# Patient Record
Sex: Male | Born: 1945
Health system: Southern US, Community
[De-identification: ages and names within clinical notes are randomized; demographics above are authoritative.]

## PROBLEM LIST (undated history)

## (undated) DIAGNOSIS — E785 Hyperlipidemia, unspecified: Secondary | ICD-10-CM

## (undated) DIAGNOSIS — I10 Essential (primary) hypertension: Secondary | ICD-10-CM

## (undated) HISTORY — DX: Hyperlipidemia, unspecified: E78.5

## (undated) HISTORY — DX: Essential (primary) hypertension: I10

---

## 1995-02-21 LAB — BASIC METABOLIC PANEL
BUN: 16 (ref 4–21)
BUN: 16 (ref 4–21)
CO2: 28 — AB (ref 13–22)
CO2: 28 — AB (ref 13–22)
Chloride: 108 (ref 99–108)
Chloride: 108 (ref 99–108)
Creatinine: 1.2 (ref 0.6–1.3)
Creatinine: 1.2 (ref 0.6–1.3)
Glucose: 92
Glucose: 92
Potassium: 4.7 mEq/L (ref 3.5–5.1)
Potassium: 4.7 mEq/L (ref 3.5–5.1)
Sodium: 144 (ref 137–147)
Sodium: 144 (ref 137–147)

## 1995-02-21 LAB — LIPID PANEL
Cholesterol: 217 — AB (ref 0–200)
HDL: 48 (ref 35–70)
LDL Cholesterol: 149
LDl/HDL Ratio: 4.5
Triglycerides: 101 (ref 40–160)

## 1995-02-21 LAB — CBC AND DIFFERENTIAL
HCT: 44 (ref 41–53)
HCT: 44 (ref 41–53)
Hemoglobin: 14.8 (ref 13.5–17.5)
Hemoglobin: 14.8 (ref 13.5–17.5)
Platelets: 201 10*3/uL (ref 150–400)
Platelets: 201 10*3/uL (ref 150–400)
WBC: 6.3
WBC: 6.3

## 1995-02-21 LAB — HEPATIC FUNCTION PANEL
ALT: 15 U/L (ref 10–40)
AST: 18 (ref 14–40)
Alkaline Phosphatase: 65 (ref 25–125)
Alkaline Phosphatase: 65 (ref 25–125)
Bilirubin, Direct: 0.1 (ref 0.01–0.4)
Bilirubin, Total: 0.4

## 1995-02-21 LAB — COMPREHENSIVE METABOLIC PANEL
Albumin: 4.7 (ref 3.5–5.0)
Calcium: 8.7 (ref 8.7–10.7)
Calcium: 8.7 (ref 8.7–10.7)
Globulin: 2.3

## 1995-02-21 LAB — CBC
RBC: 4.45 (ref 3.87–5.11)
RBC: 4.45 (ref 3.87–5.11)

## 1995-02-21 LAB — PSA: PSA: 1.5

## 1995-03-24 LAB — CBC AND DIFFERENTIAL
HCT: 44 (ref 41–53)
Hemoglobin: 14.8 (ref 13.5–17.5)
Platelets: 201 10*3/uL (ref 150–400)
WBC: 6.3

## 1995-03-24 LAB — BASIC METABOLIC PANEL
BUN: 16 (ref 4–21)
Chloride: 108 (ref 99–108)
Creatinine: 1.2 (ref 0.6–1.3)
Glucose: 92
Potassium: 4.7 mEq/L (ref 3.5–5.1)
Sodium: 144 (ref 137–147)

## 1995-03-24 LAB — CBC: RBC: 4.45 (ref 3.87–5.11)

## 1996-09-17 LAB — BASIC METABOLIC PANEL
BUN: 17 (ref 4–21)
Chloride: 107 (ref 99–108)
Creatinine: 1.1 (ref 0.6–1.3)
Glucose: 101
Potassium: 4.4 mEq/L (ref 3.5–5.1)
Sodium: 141 (ref 137–147)

## 1996-09-17 LAB — TSH: TSH: 0.64 (ref 0.41–5.90)

## 1996-09-17 LAB — LIPID PANEL
Cholesterol: 219 — AB (ref 0–200)
HDL: 62 (ref 35–70)
LDL Cholesterol: 142
LDl/HDL Ratio: 3.5
Triglycerides: 73 (ref 40–160)

## 1996-09-17 LAB — CBC: RBC: 4.56 (ref 3.87–5.11)

## 1996-09-17 LAB — HEPATIC FUNCTION PANEL
AST: 23 (ref 14–40)
Alkaline Phosphatase: 68 (ref 25–125)
Bilirubin, Total: 0.6

## 1996-09-17 LAB — CBC AND DIFFERENTIAL
HCT: 46 (ref 41–53)
Hemoglobin: 15.2 (ref 13.5–17.5)
Platelets: 182 10*3/uL (ref 150–400)
WBC: 10.2

## 1996-09-17 LAB — PSA: PSA: 1.4

## 1996-09-17 LAB — COMPREHENSIVE METABOLIC PANEL
Albumin: 4.5 (ref 3.5–5.0)
Calcium: 9.2 (ref 8.7–10.7)

## 1997-12-03 LAB — BASIC METABOLIC PANEL
BUN: 18 (ref 4–21)
CO2: 25 — AB (ref 13–22)
Chloride: 106 (ref 99–108)
Creatinine: 1.1 (ref <=1.3)
Glucose: 87
Potassium: 4.5 mEq/L (ref 3.5–5.1)

## 1997-12-03 LAB — LIPID PANEL
Cholesterol: 272 — AB (ref 0–200)
HDL: 53 (ref 35–70)
LDL Cholesterol: 177
LDl/HDL Ratio: 5.1
Triglycerides: 211 — AB (ref 40–160)

## 1997-12-03 LAB — HEPATIC FUNCTION PANEL
AST: 24 (ref 14–40)
Alkaline Phosphatase: 76 (ref 25–125)
Bilirubin, Total: 7.1

## 1997-12-03 LAB — CBC AND DIFFERENTIAL
HCT: 47 (ref 41–53)
Hemoglobin: 16.3 (ref 13.5–17.5)
Platelets: 259 10*3/uL (ref 150–400)
WBC: 7

## 1997-12-03 LAB — COMPREHENSIVE METABOLIC PANEL
Albumin: 4.8 (ref 3.5–5.0)
Calcium: 10.3 (ref 8.7–10.7)

## 1997-12-03 LAB — CBC: RBC: 4.93 (ref 3.87–5.11)

## 1997-12-03 LAB — IRON,TIBC AND FERRITIN PANEL: Iron: 110

## 1998-01-12 LAB — HEPATIC FUNCTION PANEL
ALT: 24 U/L (ref 10–40)
AST: 26 (ref 14–40)
Alkaline Phosphatase: 69 (ref 25–125)
Alkaline Phosphatase: 69 (ref 25–125)
Bilirubin, Direct: 0.1 (ref 0.01–0.4)
Bilirubin, Total: 0.3

## 1998-01-12 LAB — PSA: PSA: 1.5

## 1998-01-12 LAB — COMPREHENSIVE METABOLIC PANEL: Albumin: 4.3 (ref 3.5–5.0)

## 1998-04-02 LAB — LIPID PANEL
Cholesterol: 173 (ref 0–200)
HDL: 53 (ref 35–70)
LDL Cholesterol: 107
LDl/HDL Ratio: 3.3
Triglycerides: 66 (ref 40–160)

## 1998-09-03 LAB — BASIC METABOLIC PANEL
BUN: 14 (ref 4–21)
CO2: 26 — AB (ref 13–22)
Chloride: 100 (ref 99–108)
Creatinine: 1.2 (ref <=1.3)
Glucose: 90
Potassium: 4 mEq/L (ref 3.5–5.1)
Sodium: 138 (ref 137–147)

## 1998-09-03 LAB — HEPATIC FUNCTION PANEL: Alkaline Phosphatase: 58 (ref 25–125)

## 1998-10-04 LAB — HEPATIC FUNCTION PANEL
ALT: 87 U/L — AB (ref 10–40)
AST: 50 — AB (ref 14–40)
Bilirubin, Direct: 0.1 (ref 0.01–0.4)
Bilirubin, Total: 0.7

## 1998-10-04 LAB — COMPREHENSIVE METABOLIC PANEL: Albumin: 4.2 (ref 3.5–5.0)

## 1998-10-15 LAB — COMPREHENSIVE METABOLIC PANEL: Albumin: 3.6 (ref 3.5–5.0)

## 1998-10-15 LAB — HEPATIC FUNCTION PANEL
ALT: 25 U/L (ref 10–40)
AST: 19 (ref 14–40)
Bilirubin, Direct: 0.3
Bilirubin, Total: 0.5

## 1999-03-02 LAB — LIPID PANEL
Cholesterol: 151 (ref 0–200)
HDL: 57 (ref 35–70)
LDL Cholesterol: 79
LDl/HDL Ratio: 2.6
Triglycerides: 75 (ref 40–160)

## 1999-03-02 LAB — HEPATIC FUNCTION PANEL
ALT: 30 U/L (ref 10–40)
AST: 33 (ref 14–40)
Alkaline Phosphatase: 56 (ref 25–125)
Bilirubin, Total: 0.8

## 1999-03-02 LAB — CBC AND DIFFERENTIAL
HCT: 49 (ref 41–53)
Hemoglobin: 16.3 (ref 13.5–17.5)
Platelets: 207 10*3/uL (ref 150–400)
WBC: 7.6

## 1999-03-02 LAB — COMPREHENSIVE METABOLIC PANEL
Albumin: 4.6 (ref 3.5–5.0)
Calcium: 9.5 (ref 8.7–10.7)

## 1999-03-02 LAB — BASIC METABOLIC PANEL
BUN: 19 (ref 4–21)
CO2: 22 (ref 13–22)
Chloride: 104 (ref 99–108)
Creatinine: 1.3 (ref 0.6–1.3)
Glucose: 85
Potassium: 4.4 mEq/L (ref 3.5–5.1)
Sodium: 141 (ref 137–147)

## 1999-03-02 LAB — CBC: RBC: 4.86 (ref 3.87–5.11)

## 1999-03-02 LAB — PSA: PSA: 1.3

## 2000-03-07 LAB — CBC AND DIFFERENTIAL
HCT: 48 (ref 41–53)
Hemoglobin: 16 (ref 13.5–17.5)
Platelets: 179 10*3/uL (ref 150–400)
WBC: 7.6

## 2000-03-07 LAB — LIPID PANEL
Cholesterol: 172 (ref 0–200)
HDL: 58 (ref 35–70)
LDL Cholesterol: 102
LDl/HDL Ratio: 3
Triglycerides: 62 (ref 40–160)

## 2000-03-07 LAB — BASIC METABOLIC PANEL
BUN: 15 (ref 4–21)
CO2: 21 (ref 13–22)
Chloride: 107 (ref 99–108)
Creatinine: 1.3 (ref 0.6–1.3)
Glucose: 90
Potassium: 4.2 mEq/L (ref 3.5–5.1)
Sodium: 142 (ref 137–147)

## 2000-03-07 LAB — PSA: PSA: 1.56

## 2000-03-07 LAB — COMPREHENSIVE METABOLIC PANEL
Albumin: 4.9 (ref 3.5–5.0)
Calcium: 10.2 (ref 8.7–10.7)

## 2000-03-07 LAB — HEPATIC FUNCTION PANEL
ALT: 19 U/L (ref 10–40)
AST: 23 (ref 14–40)
Alkaline Phosphatase: 64 (ref 25–125)
Bilirubin, Total: 0.5

## 2000-03-07 LAB — CBC: RBC: 4.81 (ref 3.87–5.11)

## 2000-05-09 LAB — POCT ERYTHROCYTE SEDIMENTATION RATE, NON-AUTOMATED: Sed Rate: 7

## 2000-09-05 LAB — TSH: TSH: 0.87 (ref <=5.90)

## 2001-03-07 LAB — CBC AND DIFFERENTIAL
HCT: 44 (ref 41–53)
Hemoglobin: 15.4 (ref 13.5–17.5)
Platelets: 180 10*3/uL (ref 150–400)
WBC: 6.4

## 2001-03-07 LAB — PSA: PSA: 1.29

## 2001-03-07 LAB — BASIC METABOLIC PANEL
BUN: 16 (ref 4–21)
CO2: 24 — AB (ref 13–22)
Chloride: 105 (ref 99–108)
Creatinine: 1.3 (ref 0.6–1.3)
Glucose: 84
Potassium: 4.3 mEq/L (ref 3.5–5.1)
Sodium: 141 (ref 137–147)

## 2001-03-07 LAB — HEPATIC FUNCTION PANEL
ALT: 19 U/L (ref 10–40)
AST: 25 (ref 14–40)
Alkaline Phosphatase: 53 (ref 25–125)
Bilirubin, Total: 0.8

## 2001-03-07 LAB — CBC: RBC: 4.57 (ref 3.87–5.11)

## 2001-03-07 LAB — COMPREHENSIVE METABOLIC PANEL
Albumin: 4.7 (ref 3.5–5.0)
Calcium: 9.9 (ref 8.7–10.7)

## 2001-04-27 LAB — LIPID PANEL
Cholesterol: 194 (ref 0–200)
HDL: 58 (ref 35–70)
LDL Cholesterol: 121
LDl/HDL Ratio: 3.3
Triglycerides: 74 (ref 40–160)

## 2001-04-27 LAB — COMPREHENSIVE METABOLIC PANEL
Albumin: 4.5 (ref 3.5–5.0)
Globulin: 2

## 2001-04-27 LAB — HEPATIC FUNCTION PANEL
ALT: 30 U/L (ref 10–40)
AST: 30 (ref 14–40)
Alkaline Phosphatase: 68 (ref 25–125)
Bilirubin, Total: 0.5

## 2002-01-15 LAB — FECAL OCCULT BLOOD, GUAIAC: Fecal Occult Blood: NEGATIVE

## 2002-03-08 LAB — COMPREHENSIVE METABOLIC PANEL
Albumin: 5 (ref 3.5–5.0)
Calcium: 9.2 (ref 8.7–10.7)

## 2002-03-08 LAB — CBC AND DIFFERENTIAL
HCT: 45 (ref 41–53)
Hemoglobin: 15.2 (ref 13.5–17.5)
Platelets: 201 10*3/uL (ref 150–400)
WBC: 7.9
WBC: 7.9

## 2002-03-08 LAB — BASIC METABOLIC PANEL
CO2: 20 (ref 13–22)
Chloride: 106 (ref 99–108)
Glucose: 90
Potassium: 4.3 mEq/L (ref 3.5–5.1)
Sodium: 140 (ref 137–147)

## 2002-03-08 LAB — HEPATIC FUNCTION PANEL
ALT: 18 U/L (ref 10–40)
AST: 21 (ref 14–40)
Alkaline Phosphatase: 62 (ref 25–125)
Bilirubin, Total: 0.6

## 2002-03-08 LAB — CBC
RBC: 4.56 (ref 3.87–5.11)
RBC: 4.59 (ref 3.87–5.11)

## 2002-03-08 LAB — LIPID PANEL
Cholesterol: 176 (ref 0–200)
HDL: 59 (ref 35–70)
LDL Cholesterol: 100
LDl/HDL Ratio: 3
Triglycerides: 85 (ref 40–160)

## 2002-03-08 LAB — PSA: PSA: 1.54

## 2002-09-05 DIAGNOSIS — L280 Lichen simplex chronicus: Secondary | ICD-10-CM

## 2002-09-05 HISTORY — DX: Lichen simplex chronicus: L28.0

## 2003-03-12 LAB — BASIC METABOLIC PANEL
BUN: 18 (ref 4–21)
CO2: 27 — AB (ref 13–22)
Chloride: 105 (ref 99–108)
Creatinine: 1.2 (ref 0.6–1.3)
Glucose: 90
Potassium: 4.8 mEq/L (ref 3.5–5.1)
Sodium: 141 (ref 137–147)

## 2003-03-12 LAB — CBC: RBC: 4.61 (ref 3.87–5.11)

## 2003-03-12 LAB — LIPID PANEL
Cholesterol: 164 (ref 0–200)
HDL: 60 (ref 35–70)
LDL Cholesterol: 89
LDl/HDL Ratio: 2.7
Triglycerides: 77 (ref 40–160)

## 2003-03-12 LAB — COMPREHENSIVE METABOLIC PANEL
Albumin: 4.6 (ref 3.5–5.0)
Calcium: 9.6 (ref 8.7–10.7)

## 2003-03-12 LAB — CBC AND DIFFERENTIAL
HCT: 46 (ref 41–53)
Hemoglobin: 15.5 (ref 13.5–17.5)
Platelets: 190 10*3/uL (ref 150–400)
WBC: 6.7

## 2003-03-12 LAB — HEPATIC FUNCTION PANEL
ALT: 20 U/L (ref 10–40)
AST: 23 (ref 14–40)
Alkaline Phosphatase: 56 (ref 25–125)
Bilirubin, Total: 0.6

## 2003-05-21 LAB — BASIC METABOLIC PANEL
BUN: 18 (ref 4–21)
CO2: 31 — AB (ref 13–22)
Chloride: 94 — AB (ref 99–108)
Creatinine: 1.4 — AB (ref 0.6–1.3)
Glucose: 89
Potassium: 4.6 mEq/L (ref 3.5–5.1)
Sodium: 137 (ref 137–147)

## 2003-05-21 LAB — COMPREHENSIVE METABOLIC PANEL
Albumin: 4.7 (ref 3.5–5.0)
Calcium: 9.9 (ref 8.7–10.7)

## 2003-05-21 LAB — HEPATIC FUNCTION PANEL
ALT: 27 U/L (ref 10–40)
AST: 29 (ref 14–40)
Alkaline Phosphatase: 55 (ref 25–125)
Bilirubin, Total: 0.6

## 2003-09-16 LAB — BASIC METABOLIC PANEL
BUN: 15 (ref 4–21)
CO2: 27 — AB (ref 13–22)
Chloride: 100 (ref 99–108)
Creatinine: 1.2 (ref 0.6–1.3)
Glucose: 91
Potassium: 4.1 mEq/L (ref 3.5–5.1)
Sodium: 138 (ref 137–147)

## 2003-09-16 LAB — HEPATIC FUNCTION PANEL
ALT: 18 U/L (ref 10–40)
AST: 23 (ref 14–40)
Alkaline Phosphatase: 59 (ref 25–125)
Bilirubin, Total: 0.7

## 2003-09-16 LAB — COMPREHENSIVE METABOLIC PANEL
Albumin: 4.7 (ref 3.5–5.0)
Calcium: 9.6 (ref 8.7–10.7)

## 2004-03-31 LAB — LIPID PANEL
Cholesterol: 174 (ref 0–200)
HDL: 59 (ref 35–70)
LDL Cholesterol: 95
LDl/HDL Ratio: 2.9
Triglycerides: 98 (ref 40–160)

## 2004-03-31 LAB — COMPREHENSIVE METABOLIC PANEL
Albumin: 4.6 (ref 3.5–5.0)
Calcium: 9.4 (ref 8.7–10.7)

## 2004-03-31 LAB — HEPATIC FUNCTION PANEL
ALT: 16 U/L (ref 10–40)
AST: 24 (ref 14–40)
Alkaline Phosphatase: 51 (ref 25–125)
Bilirubin, Total: 0.8

## 2004-03-31 LAB — BASIC METABOLIC PANEL
BUN: 17 (ref 4–21)
CO2: 25 — AB (ref 13–22)
Chloride: 96 — AB (ref 99–108)
Creatinine: 1.1 (ref 0.6–1.3)
Glucose: 89
Potassium: 3.7 mEq/L (ref 3.5–5.1)
Sodium: 133 — AB (ref 137–147)

## 2004-03-31 LAB — CBC AND DIFFERENTIAL
HCT: 44 (ref 41–53)
Hemoglobin: 15.3 (ref 13.5–17.5)
Platelets: 234 10*3/uL (ref 150–400)
WBC: 6.5

## 2004-03-31 LAB — TSH: TSH: 0.18 — AB (ref 0.41–5.90)

## 2004-03-31 LAB — PSA: PSA: 1.45

## 2004-03-31 LAB — CBC: RBC: 4.58 (ref 3.87–5.11)

## 2004-10-06 LAB — HEPATIC FUNCTION PANEL
ALT: 18 U/L (ref 10–40)
AST: 23 (ref 14–40)
Alkaline Phosphatase: 54 (ref 25–125)
Bilirubin, Total: 0.6

## 2004-10-06 LAB — LIPID PANEL
Cholesterol: 163 (ref 0–200)
HDL: 58 (ref 35–70)
LDL Cholesterol: 92
LDl/HDL Ratio: 2.8
Triglycerides: 63 (ref 40–160)

## 2004-10-06 LAB — BASIC METABOLIC PANEL
BUN: 14 (ref 4–21)
CO2: 28 — AB (ref 13–22)
Chloride: 96 — AB (ref 99–108)
Creatinine: 1.1 (ref 0.6–1.3)
Glucose: 86
Potassium: 3.9 mEq/L (ref 3.5–5.1)
Sodium: 132 — AB (ref 137–147)

## 2004-10-06 LAB — COMPREHENSIVE METABOLIC PANEL
Albumin: 4.5 (ref 3.5–5.0)
Calcium: 8.9 (ref 8.7–10.7)

## 2004-10-07 LAB — BASIC METABOLIC PANEL
BUN: 14 (ref 4–21)
CO2: 28 — AB (ref 13–22)
Chloride: 96 — AB (ref 99–108)
Creatinine: 1.1 (ref 0.6–1.3)
Glucose: 86
Potassium: 3.9 mEq/L (ref 3.5–5.1)
Sodium: 132 — AB (ref 137–147)

## 2004-10-07 LAB — LIPID PANEL
Cholesterol: 163 (ref 0–200)
HDL: 58 (ref 35–70)
LDL Cholesterol: 92
LDl/HDL Ratio: 2.8
Triglycerides: 63 (ref 40–160)

## 2004-10-07 LAB — HEPATIC FUNCTION PANEL
ALT: 18 U/L (ref 10–40)
AST: 23 (ref 14–40)
Alkaline Phosphatase: 54 (ref 25–125)
Bilirubin, Total: 0.6

## 2004-10-07 LAB — COMPREHENSIVE METABOLIC PANEL WITH GFR
Albumin: 4.5 (ref 3.5–5.0)
Calcium: 8.9 (ref 8.7–10.7)

## 2004-10-18 LAB — COMPREHENSIVE METABOLIC PANEL
Albumin: 4.2 (ref 3.5–5.0)
Calcium: 9.8 (ref 8.7–10.7)

## 2004-10-18 LAB — BASIC METABOLIC PANEL WITH GFR
BUN: 11 (ref 4–21)
CO2: 29 — AB (ref 13–22)
Chloride: 94 — AB (ref 99–108)
Creatinine: 1.2 (ref 0.6–1.3)
Glucose: 101
Potassium: 4.2 meq/L (ref 3.5–5.1)
Sodium: 133 — AB (ref 137–147)

## 2004-10-18 LAB — HEPATIC FUNCTION PANEL
ALT: 19 U/L (ref 10–40)
AST: 23 (ref 14–40)
Alkaline Phosphatase: 55 (ref 25–125)
Bilirubin, Total: 0.5

## 2004-11-01 LAB — BASIC METABOLIC PANEL
BUN: 12 (ref 4–21)
CO2: 25 — AB (ref 13–22)
Chloride: 103 (ref 99–108)
Creatinine: 1.1 (ref 0.6–1.3)
Glucose: 89
Potassium: 4.5 mEq/L (ref 3.5–5.1)
Sodium: 138 (ref 137–147)

## 2004-11-01 LAB — HEPATIC FUNCTION PANEL
ALT: 15 U/L (ref 10–40)
AST: 18 (ref 14–40)
Alkaline Phosphatase: 46 (ref 25–125)
Bilirubin, Total: 0.5

## 2004-11-01 LAB — COMPREHENSIVE METABOLIC PANEL: Calcium: 9.4 (ref 8.7–10.7)

## 2005-04-19 LAB — LIPID PANEL
Cholesterol: 168 (ref 0–200)
HDL: 58 (ref 35–70)
LDL Cholesterol: 95
LDl/HDL Ratio: 2.9
Triglycerides: 73 (ref 40–160)

## 2005-04-19 LAB — BASIC METABOLIC PANEL
BUN: 12 (ref 4–21)
CO2: 25 — AB (ref 13–22)
Chloride: 96 — AB (ref 99–108)
Creatinine: 1.1 (ref 0.6–1.3)
Glucose: 81
Potassium: 3.9 mEq/L (ref 3.5–5.1)
Sodium: 136 — AB (ref 137–147)

## 2005-04-19 LAB — CBC AND DIFFERENTIAL
HCT: 45 (ref 41–53)
Hemoglobin: 16.1 (ref 13.5–17.5)
Platelets: 222 10*3/uL (ref 150–400)
WBC: 6

## 2005-04-19 LAB — HEPATIC FUNCTION PANEL
ALT: 23 U/L (ref 10–40)
AST: 27 (ref 14–40)
Alkaline Phosphatase: 55 (ref 25–125)
Bilirubin, Total: 0.6

## 2005-04-19 LAB — COMPREHENSIVE METABOLIC PANEL
Albumin: 4.6 (ref 3.5–5.0)
Calcium: 9.5 (ref 8.7–10.7)

## 2005-04-19 LAB — PSA: PSA: 1.24

## 2005-04-19 LAB — CBC: RBC: 4.73 (ref 3.87–5.11)

## 2005-10-24 LAB — BASIC METABOLIC PANEL
BUN: 14 (ref 4–21)
CO2: 28 — AB (ref 13–22)
Chloride: 96 — AB (ref 99–108)
Creatinine: 1.1 (ref 0.6–1.3)
Glucose: 68
Potassium: 4.2 mEq/L (ref 3.5–5.1)
Sodium: 136 — AB (ref 137–147)

## 2005-10-24 LAB — HEPATIC FUNCTION PANEL
ALT: 15 U/L (ref 10–40)
AST: 20 (ref 14–40)
Alkaline Phosphatase: 54 (ref 25–125)
Bilirubin, Total: 0.6

## 2005-10-24 LAB — COMPREHENSIVE METABOLIC PANEL
Albumin: 4.4 (ref 3.5–5.0)
Calcium: 9.6 (ref 8.7–10.7)

## 2006-05-26 LAB — CBC AND DIFFERENTIAL
HCT: 47 (ref 41–53)
Hemoglobin: 16.1 (ref 13.5–17.5)
Platelets: 200 10*3/uL (ref 150–400)
WBC: 6.7

## 2006-05-26 LAB — LIPID PANEL
Cholesterol: 217 — AB (ref 0–200)
HDL: 60 (ref 35–70)
LDL Cholesterol: 135
LDl/HDL Ratio: 3.6
Triglycerides: 112 (ref 40–160)

## 2006-05-26 LAB — BASIC METABOLIC PANEL
BUN: 16 (ref 4–21)
CO2: 26 — AB (ref 13–22)
Chloride: 101 (ref 99–108)
Creatinine: 1.1 (ref 0.6–1.3)
Glucose: 83
Potassium: 3.6 mEq/L (ref 3.5–5.1)
Sodium: 140 (ref 137–147)

## 2006-05-26 LAB — HEPATIC FUNCTION PANEL
ALT: 21 U/L (ref 10–40)
AST: 22 (ref 14–40)
Alkaline Phosphatase: 60 (ref 25–125)
Bilirubin, Total: 0.8

## 2006-05-26 LAB — CBC: RBC: 4.8 (ref 3.87–5.11)

## 2006-05-26 LAB — COMPREHENSIVE METABOLIC PANEL
Albumin: 4.5 (ref 3.5–5.0)
Calcium: 9.3 (ref 8.7–10.7)

## 2006-05-26 LAB — PSA: PSA: 3.08

## 2006-12-20 LAB — BASIC METABOLIC PANEL
BUN: 17 (ref 4–21)
CO2: 26 — AB (ref 13–22)
Chloride: 102 (ref 99–108)
Creatinine: 1.1 (ref 0.6–1.3)
Glucose: 87
Potassium: 4.2 mEq/L (ref 3.5–5.1)
Sodium: 139 (ref 137–147)

## 2006-12-20 LAB — LIPID PANEL
Cholesterol: 188 (ref 0–200)
HDL: 57 (ref 35–70)
LDL Cholesterol: 111
LDl/HDL Ratio: 3.3
Triglycerides: 102 (ref 40–160)

## 2006-12-20 LAB — HEPATIC FUNCTION PANEL
ALT: 27 U/L (ref 10–40)
AST: 26 (ref 14–40)
Alkaline Phosphatase: 58 (ref 25–125)
Bilirubin, Total: 0.6

## 2006-12-20 LAB — COMPREHENSIVE METABOLIC PANEL
Albumin: 4.6 (ref 3.5–5.0)
Calcium: 9.7 (ref 8.7–10.7)

## 2006-12-20 LAB — PSA: PSA: 2

## 2007-10-16 LAB — PSA: PSA: 2.03

## 2007-10-16 LAB — HEPATIC FUNCTION PANEL
ALT: 17 U/L (ref 10–40)
AST: 22 (ref 14–40)
Alkaline Phosphatase: 61 (ref 25–125)
Bilirubin, Total: 0.6

## 2007-10-16 LAB — CBC AND DIFFERENTIAL
HCT: 47 (ref 41–53)
Hemoglobin: 15.9 (ref 13.5–17.5)
Platelets: 226 10*3/uL (ref 150–400)
WBC: 5.8

## 2007-10-16 LAB — BASIC METABOLIC PANEL
BUN: 12 (ref 4–21)
CO2: 25 — AB (ref 13–22)
Chloride: 100 (ref 99–108)
Creatinine: 1.3 (ref 0.6–1.3)
Glucose: 89
Potassium: 4 mEq/L (ref 3.5–5.1)
Sodium: 138 (ref 137–147)

## 2007-10-16 LAB — LIPID PANEL
Cholesterol: 177 (ref 0–200)
HDL: 58 (ref 35–70)
LDL Cholesterol: 100
LDl/HDL Ratio: 3.1
Triglycerides: 97 (ref 40–160)

## 2007-10-16 LAB — COMPREHENSIVE METABOLIC PANEL
Albumin: 4.6 (ref 3.5–5.0)
Calcium: 9.3 (ref 8.7–10.7)

## 2007-10-16 LAB — CBC: RBC: 4.66 (ref 3.87–5.11)

## 2008-04-16 LAB — BASIC METABOLIC PANEL
BUN: 13 (ref 4–21)
CO2: 26 — AB (ref 13–22)
Chloride: 102 (ref 99–108)
Creatinine: 1.2 (ref 0.6–1.3)
Potassium: 4.5 mEq/L (ref 3.5–5.1)
Sodium: 142 (ref 137–147)

## 2008-04-16 LAB — LIPID PANEL
Cholesterol: 164 (ref 0–200)
HDL: 53 (ref 35–70)
LDL Cholesterol: 92
LDl/HDL Ratio: 3.1
Triglycerides: 95 (ref 40–160)

## 2008-04-16 LAB — HEPATIC FUNCTION PANEL
ALT: 21 U/L (ref 10–40)
AST: 24 (ref 14–40)
Alkaline Phosphatase: 53 (ref 25–125)
Bilirubin, Total: 0.5

## 2008-04-16 LAB — COMPREHENSIVE METABOLIC PANEL
Albumin: 4.2 (ref 3.5–5.0)
Calcium: 9.4 (ref 8.7–10.7)

## 2008-11-29 LAB — CBC AND DIFFERENTIAL
HCT: 44 (ref 41–53)
Hemoglobin: 15.4 (ref 13.5–17.5)
Platelets: 201 10*3/uL (ref 150–400)
WBC: 5.6

## 2008-11-29 LAB — LIPID PANEL
Cholesterol: 168 (ref 0–200)
HDL: 54 (ref 35–70)
LDL Cholesterol: 97
LDl/HDL Ratio: 3.1
Triglycerides: 84 (ref 40–160)

## 2008-11-29 LAB — BASIC METABOLIC PANEL
BUN: 10 (ref 4–21)
CO2: 28 — AB (ref 13–22)
Chloride: 96 — AB (ref 99–108)
Creatinine: 1 (ref 0.6–1.3)
Glucose: 85
Potassium: 3.9 mEq/L (ref 3.5–5.1)
Sodium: 133 — AB (ref 137–147)

## 2008-11-29 LAB — COMPREHENSIVE METABOLIC PANEL
Albumin: 4.4 (ref 3.5–5.0)
Calcium: 9.6 (ref 8.7–10.7)

## 2008-11-29 LAB — HEPATIC FUNCTION PANEL
ALT: 13 U/L (ref 10–40)
AST: 16 (ref 14–40)
Alkaline Phosphatase: 53 (ref 25–125)
Bilirubin, Total: 0.5

## 2008-11-29 LAB — CBC: RBC: 4.55 (ref 3.87–5.11)

## 2008-11-29 LAB — PSA: PSA: 1.98

## 2008-12-02 LAB — FECAL OCCULT BLOOD, GUAIAC: Fecal Occult Blood: NEGATIVE

## 2009-06-06 LAB — BASIC METABOLIC PANEL
BUN: 17 (ref 4–21)
CO2: 25 — AB (ref 13–22)
Chloride: 101 (ref 99–108)
Creatinine: 1.2 (ref 0.6–1.3)
Glucose: 94
Potassium: 4.2 mEq/L (ref 3.5–5.1)
Sodium: 137 (ref 137–147)

## 2009-06-06 LAB — HEPATIC FUNCTION PANEL
ALT: 16 U/L (ref 10–40)
AST: 23 (ref 14–40)
Alkaline Phosphatase: 54 (ref 25–125)
Bilirubin, Total: 0.7

## 2009-06-06 LAB — LIPID PANEL
Cholesterol: 175 (ref 0–200)
HDL: 62 (ref 35–70)
LDL Cholesterol: 93
LDl/HDL Ratio: 2.8
Triglycerides: 98 (ref 40–160)

## 2009-06-06 LAB — COMPREHENSIVE METABOLIC PANEL: Calcium: 10 (ref 8.7–10.7)

## 2009-12-09 LAB — CBC AND DIFFERENTIAL
HCT: 49 (ref 41–53)
Hemoglobin: 16.5 (ref 13.5–17.5)
Platelets: 230 10*3/uL (ref 150–400)
WBC: 8

## 2009-12-09 LAB — LIPID PANEL
Cholesterol: 179 (ref 0–200)
HDL: 57 (ref 35–70)
LDL Cholesterol: 103
LDl/HDL Ratio: 3.1
Triglycerides: 95 (ref 40–160)

## 2009-12-09 LAB — HEPATIC FUNCTION PANEL
ALT: 22 U/L (ref 10–40)
AST: 31 (ref 14–40)
Alkaline Phosphatase: 56 (ref 25–125)
Bilirubin, Total: 0.8

## 2009-12-09 LAB — BASIC METABOLIC PANEL
BUN: 19 (ref 4–21)
CO2: 27 — AB (ref 13–22)
Chloride: 98 — AB (ref 99–108)
Creatinine: 1.2 (ref 0.6–1.3)
Glucose: 87
Potassium: 4.7 mEq/L (ref 3.5–5.1)
Sodium: 137 (ref 137–147)

## 2009-12-09 LAB — PSA: PSA: 2.56

## 2009-12-09 LAB — CBC: RBC: 4.81 (ref 3.87–5.11)

## 2009-12-09 LAB — COMPREHENSIVE METABOLIC PANEL: Calcium: 10.4 (ref 8.7–10.7)

## 2010-06-17 LAB — BASIC METABOLIC PANEL
BUN: 12 (ref 4–21)
CO2: 24 — AB (ref 13–22)
Chloride: 103 (ref 99–108)
Creatinine: 1 (ref 0.6–1.3)
Glucose: 86
Potassium: 4.1 mEq/L (ref 3.5–5.1)
Sodium: 138 (ref 137–147)

## 2010-06-17 LAB — COMPREHENSIVE METABOLIC PANEL
Albumin: 4.2 (ref 3.5–5.0)
Calcium: 9 (ref 8.7–10.7)

## 2010-06-17 LAB — LIPID PANEL
Cholesterol: 176 (ref 0–200)
HDL: 46 (ref 35–70)
LDL Cholesterol: 102
Triglycerides: 140 (ref 40–160)

## 2010-06-17 LAB — HEPATIC FUNCTION PANEL
ALT: 15 U/L (ref 10–40)
AST: 18 (ref 14–40)
Alkaline Phosphatase: 48 (ref 25–125)
Bilirubin, Total: 0.5

## 2010-12-22 LAB — BASIC METABOLIC PANEL
BUN: 13 (ref 4–21)
CO2: 28 — AB (ref 13–22)
Chloride: 96 — AB (ref 99–108)
Creatinine: 1 (ref 0.6–1.3)
Glucose: 90
Potassium: 4.5 mEq/L (ref 3.5–5.1)
Sodium: 136 — AB (ref 137–147)

## 2010-12-22 LAB — HEPATIC FUNCTION PANEL
ALT: 18 U/L (ref 10–40)
AST: 26 (ref 14–40)
Alkaline Phosphatase: 50 (ref 25–125)
Bilirubin, Total: 0.7

## 2010-12-22 LAB — CBC AND DIFFERENTIAL
HCT: 46 (ref 41–53)
Hemoglobin: 15.5 (ref 13.5–17.5)
Platelets: 233 10*3/uL (ref 150–400)
WBC: 6.3

## 2010-12-22 LAB — LIPID PANEL
Cholesterol: 187 (ref 0–200)
HDL: 54 (ref 35–70)
LDL Cholesterol: 111
LDl/HDL Ratio: 3.5
Triglycerides: 112 (ref 40–160)

## 2010-12-22 LAB — COMPREHENSIVE METABOLIC PANEL
Albumin: 4.5 (ref 3.5–5.0)
Calcium: 9.7 (ref 8.7–10.7)

## 2010-12-22 LAB — CBC: RBC: 4.55 (ref 3.87–5.11)

## 2010-12-22 LAB — PSA: PSA: 2.42

## 2011-06-24 LAB — LIPID PANEL
Cholesterol: 147 (ref 0–200)
HDL: 51 (ref 35–70)
LDL Cholesterol: 83
LDl/HDL Ratio: 2.9
Triglycerides: 66 (ref 40–160)

## 2011-06-24 LAB — BASIC METABOLIC PANEL
BUN: 9 (ref 4–21)
CO2: 28 — AB (ref 13–22)
Chloride: 98 — AB (ref 99–108)
Creatinine: 1 (ref 0.6–1.3)
Glucose: 82
Potassium: 4.2 mEq/L (ref 3.5–5.1)
Sodium: 135 — AB (ref 137–147)

## 2011-06-24 LAB — HEPATIC FUNCTION PANEL
ALT: 16 U/L (ref 10–40)
AST: 20 (ref 14–40)
Alkaline Phosphatase: 50 (ref 25–125)
Bilirubin, Total: 0.5

## 2011-06-24 LAB — COMPREHENSIVE METABOLIC PANEL
Albumin: 4.5 (ref 3.5–5.0)
Calcium: 9.4 (ref 8.7–10.7)

## 2012-01-13 LAB — BASIC METABOLIC PANEL
BUN: 11 (ref 4–21)
CO2: 32 — AB (ref 13–22)
Chloride: 99 (ref 99–108)
Creatinine: 0.9 (ref 0.6–1.3)
Glucose: 90
Potassium: 4.4 mEq/L (ref 3.5–5.1)
Sodium: 134 — AB (ref 137–147)

## 2012-01-13 LAB — LIPID PANEL
Cholesterol: 181 (ref 0–200)
HDL: 55 (ref 35–70)
LDL Cholesterol: 100
LDl/HDL Ratio: 3.3
Triglycerides: 128 (ref 40–160)

## 2012-01-13 LAB — COMPREHENSIVE METABOLIC PANEL
Albumin: 4.4 (ref 3.5–5.0)
Calcium: 9.6 (ref 8.7–10.7)

## 2012-01-13 LAB — HEPATIC FUNCTION PANEL
ALT: 16 U/L (ref 10–40)
AST: 21 (ref 14–40)
Alkaline Phosphatase: 52 (ref 25–125)
Bilirubin, Total: 0.5

## 2012-01-13 LAB — CBC: RBC: 4.79 (ref 3.87–5.11)

## 2012-01-13 LAB — CBC AND DIFFERENTIAL
HCT: 49 (ref 41–53)
Hemoglobin: 16.3 (ref 13.5–17.5)
Platelets: 263 10*3/uL (ref 150–400)
WBC: 6.1

## 2012-01-13 LAB — PSA: PSA: 2.1

## 2012-01-13 LAB — TSH: TSH: 1.09 (ref 0.41–5.90)

## 2012-07-26 LAB — COMPREHENSIVE METABOLIC PANEL
Albumin: 4.5 (ref 3.5–5.0)
Calcium: 9.5 (ref 8.7–10.7)

## 2012-07-26 LAB — LIPID PANEL
Cholesterol: 168 (ref 0–200)
HDL: 47 (ref 35–70)
LDL Cholesterol: 100
LDl/HDL Ratio: 3.6
Triglycerides: 107 (ref 40–160)

## 2012-07-26 LAB — BASIC METABOLIC PANEL
BUN: 9 (ref 4–21)
CO2: 27 — AB (ref 13–22)
Chloride: 93 — AB (ref 99–108)
Creatinine: 0.9 (ref 0.6–1.3)
Glucose: 44
Potassium: 3.5 mEq/L (ref 3.5–5.1)
Sodium: 133 — AB (ref 137–147)

## 2012-07-26 LAB — HEPATIC FUNCTION PANEL
ALT: 16 U/L (ref 10–40)
AST: 22 (ref 14–40)
Alkaline Phosphatase: 48 (ref 25–125)
Bilirubin, Total: 0.8

## 2012-08-01 LAB — BASIC METABOLIC PANEL
BUN: 12 (ref 4–21)
CO2: 26 — AB (ref 13–22)
Chloride: 95 — AB (ref 99–108)
Creatinine: 1.2 (ref 0.6–1.3)
Glucose: 91
Potassium: 4.2 mEq/L (ref 3.5–5.1)
Sodium: 129 — AB (ref 137–147)

## 2012-08-01 LAB — COMPREHENSIVE METABOLIC PANEL
Albumin: 4.4 (ref 3.5–5.0)
Calcium: 9.3 (ref 8.7–10.7)

## 2012-08-01 LAB — HEPATIC FUNCTION PANEL
ALT: 16 U/L (ref 10–40)
AST: 21 (ref 14–40)
Alkaline Phosphatase: 44 (ref 25–125)
Bilirubin, Total: 0.7

## 2012-08-08 LAB — BASIC METABOLIC PANEL
BUN: 11 (ref 4–21)
CO2: 25 — AB (ref 13–22)
Chloride: 102 (ref 99–108)
Creatinine: 1.1 (ref 0.6–1.3)
Glucose: 105
Potassium: 4.1 mEq/L (ref 3.5–5.1)
Sodium: 136 — AB (ref 137–147)

## 2012-08-08 LAB — HEPATIC FUNCTION PANEL
ALT: 17 U/L (ref 10–40)
AST: 18 (ref 14–40)
Alkaline Phosphatase: 47 (ref 25–125)
Bilirubin, Total: 0.5

## 2012-08-08 LAB — COMPREHENSIVE METABOLIC PANEL
Albumin: 4.2 (ref 3.5–5.0)
Calcium: 9.3 (ref 8.7–10.7)

## 2012-08-21 LAB — COMPREHENSIVE METABOLIC PANEL
Albumin: 4.4 (ref 3.5–5.0)
Calcium: 9.4 (ref 8.7–10.7)

## 2012-08-22 LAB — HEPATIC FUNCTION PANEL
ALT: 19 U/L (ref 10–40)
AST: 21 (ref 14–40)
Alkaline Phosphatase: 48 (ref 25–125)
Bilirubin, Total: 0.5

## 2012-08-22 LAB — BASIC METABOLIC PANEL
BUN: 9 (ref 4–21)
CO2: 28 — AB (ref 13–22)
Chloride: 95 — AB (ref 99–108)
Creatinine: 1 (ref 0.6–1.3)
Glucose: 88
Potassium: 4 mEq/L (ref 3.5–5.1)
Sodium: 131 — AB (ref 137–147)

## 2012-10-16 LAB — COMPREHENSIVE METABOLIC PANEL
Albumin: 4.4 (ref 3.5–5.0)
Calcium: 9.6 (ref 8.7–10.7)

## 2012-10-16 LAB — HEMOGLOBIN A1C: Hemoglobin A1C: 5.4

## 2012-10-17 LAB — BASIC METABOLIC PANEL
BUN: 11 (ref 4–21)
CO2: 26 — AB (ref 13–22)
Chloride: 102 (ref 99–108)
Creatinine: 1 (ref 0.6–1.3)
Glucose: 128
Potassium: 4.4 mEq/L (ref 3.5–5.1)
Sodium: 135 — AB (ref 137–147)

## 2012-10-17 LAB — HEPATIC FUNCTION PANEL
ALT: 19 U/L (ref 10–40)
AST: 19 (ref 14–40)
Alkaline Phosphatase: 52 (ref 25–125)
Bilirubin, Total: 0.5

## 2013-02-22 LAB — CBC AND DIFFERENTIAL
HCT: 47 (ref 41–53)
Hemoglobin: 16.8 (ref 13.5–17.5)
Platelets: 216 10*3/uL (ref 150–400)
WBC: 6.5

## 2013-02-22 LAB — BASIC METABOLIC PANEL
BUN: 12 (ref 4–21)
CO2: 29 — AB (ref 13–22)
Chloride: 100 (ref 99–108)
Creatinine: 1.1 (ref 0.6–1.3)
Glucose: 96
Potassium: 4.2 mEq/L (ref 3.5–5.1)
Sodium: 136 — AB (ref 137–147)

## 2013-02-22 LAB — HEPATIC FUNCTION PANEL
ALT: 21 U/L (ref 10–40)
AST: 22 (ref 14–40)
Alkaline Phosphatase: 55 (ref 25–125)
Bilirubin, Total: 0.6

## 2013-02-22 LAB — LIPID PANEL
Cholesterol: 175 (ref 0–200)
HDL: 47 (ref 35–70)
LDL Cholesterol: 104
LDl/HDL Ratio: 3.7
Triglycerides: 122 (ref 40–160)

## 2013-02-22 LAB — COMPREHENSIVE METABOLIC PANEL
Albumin: 4.7 (ref 3.5–5.0)
Calcium: 9.4 (ref 8.7–10.7)

## 2013-02-22 LAB — CBC: RBC: 4.85 (ref 3.87–5.11)

## 2013-02-22 LAB — PSA: PSA: 2.89

## 2013-08-22 LAB — LIPID PANEL
Cholesterol: 161 (ref 0–200)
HDL: 44 (ref 35–70)
LDL Cholesterol: 88
LDl/HDL Ratio: 3.7
Triglycerides: 143 (ref 40–160)

## 2013-08-22 LAB — BASIC METABOLIC PANEL
BUN: 8 (ref 4–21)
CO2: 26 — AB (ref 13–22)
Chloride: 99 (ref 99–108)
Creatinine: 0.9 (ref 0.6–1.3)
Glucose: 94
Potassium: 4.3 mEq/L (ref 3.5–5.1)
Sodium: 133 — AB (ref 137–147)

## 2013-08-22 LAB — HEPATIC FUNCTION PANEL
ALT: 22 U/L (ref 10–40)
AST: 21 (ref 14–40)
Alkaline Phosphatase: 48 (ref 25–125)
Bilirubin, Total: 0.7

## 2013-08-22 LAB — COMPREHENSIVE METABOLIC PANEL
Albumin: 4.3 (ref 3.5–5.0)
Calcium: 9 (ref 8.7–10.7)

## 2014-03-26 DIAGNOSIS — H4010X0 Unspecified open-angle glaucoma, stage unspecified: Secondary | ICD-10-CM | POA: Diagnosis not present

## 2014-03-26 DIAGNOSIS — E780 Pure hypercholesterolemia: Secondary | ICD-10-CM | POA: Diagnosis not present

## 2014-03-26 DIAGNOSIS — J309 Allergic rhinitis, unspecified: Secondary | ICD-10-CM | POA: Diagnosis not present

## 2014-03-26 DIAGNOSIS — M109 Gout, unspecified: Secondary | ICD-10-CM | POA: Diagnosis not present

## 2014-03-26 DIAGNOSIS — I10 Essential (primary) hypertension: Secondary | ICD-10-CM | POA: Diagnosis not present

## 2014-03-27 LAB — CBC AND DIFFERENTIAL
HCT: 48 (ref 41–53)
Hemoglobin: 15.9 (ref 13.5–17.5)
Platelets: 238 10*3/uL (ref 150–400)
WBC: 5.3

## 2014-03-27 LAB — PSA: PSA: 3.42

## 2014-03-27 LAB — COMPREHENSIVE METABOLIC PANEL
Albumin: 4.2 (ref 3.5–5.0)
Calcium: 8.8 (ref 8.7–10.7)

## 2014-03-27 LAB — BASIC METABOLIC PANEL
BUN: 13 (ref 4–21)
CO2: 23 — AB (ref 13–22)
Chloride: 104 (ref 99–108)
Creatinine: 1.1 (ref 0.6–1.3)
Glucose: 88
Potassium: 4.2 mEq/L (ref 3.5–5.1)
Sodium: 136 — AB (ref 137–147)

## 2014-03-27 LAB — LIPID PANEL
Cholesterol: 124 (ref 0–200)
HDL: 34 — AB (ref 35–70)
LDL Cholesterol: 66
LDl/HDL Ratio: 3.6
Triglycerides: 122 (ref 40–160)

## 2014-03-27 LAB — HEPATIC FUNCTION PANEL
ALT: 24 U/L (ref 10–40)
AST: 23 (ref 14–40)
Alkaline Phosphatase: 64 (ref 25–125)
Bilirubin, Total: 0.3

## 2014-03-27 LAB — CBC: RBC: 4.79 (ref 3.87–5.11)

## 2014-05-27 DIAGNOSIS — H04123 Dry eye syndrome of bilateral lacrimal glands: Secondary | ICD-10-CM | POA: Diagnosis not present

## 2014-05-27 DIAGNOSIS — H2511 Age-related nuclear cataract, right eye: Secondary | ICD-10-CM | POA: Diagnosis not present

## 2014-05-27 DIAGNOSIS — H35372 Puckering of macula, left eye: Secondary | ICD-10-CM | POA: Diagnosis not present

## 2014-05-27 DIAGNOSIS — H4011X1 Primary open-angle glaucoma, mild stage: Secondary | ICD-10-CM | POA: Diagnosis not present

## 2014-10-03 DIAGNOSIS — E780 Pure hypercholesterolemia: Secondary | ICD-10-CM | POA: Diagnosis not present

## 2014-10-03 DIAGNOSIS — K5790 Diverticulosis of intestine, part unspecified, without perforation or abscess without bleeding: Secondary | ICD-10-CM | POA: Diagnosis not present

## 2014-10-03 DIAGNOSIS — Z23 Encounter for immunization: Secondary | ICD-10-CM | POA: Diagnosis not present

## 2014-10-03 DIAGNOSIS — I10 Essential (primary) hypertension: Secondary | ICD-10-CM | POA: Diagnosis not present

## 2014-10-03 DIAGNOSIS — M109 Gout, unspecified: Secondary | ICD-10-CM | POA: Diagnosis not present

## 2014-10-04 LAB — HEPATIC FUNCTION PANEL
ALT: 21 U/L (ref 10–40)
AST: 19 (ref 14–40)
Alkaline Phosphatase: 46 (ref 25–125)
Bilirubin, Total: 0.6

## 2014-10-04 LAB — BASIC METABOLIC PANEL
BUN: 13 (ref 4–21)
CO2: 25 — AB (ref 13–22)
Chloride: 104 (ref 99–108)
Creatinine: 1.1 (ref 0.6–1.3)
Potassium: 4.5 mEq/L (ref 3.5–5.1)
Sodium: 138 (ref 137–147)

## 2014-10-04 LAB — COMPREHENSIVE METABOLIC PANEL
Albumin: 4.1 (ref 3.5–5.0)
Calcium: 9.4 (ref 8.7–10.7)

## 2014-10-04 LAB — LIPID PANEL
Cholesterol: 176 (ref 0–200)
HDL: 41 (ref 35–70)
LDL Cholesterol: 100
LDl/HDL Ratio: 4.3
Triglycerides: 176 — AB (ref 40–160)

## 2014-10-09 DIAGNOSIS — Z23 Encounter for immunization: Secondary | ICD-10-CM | POA: Diagnosis not present

## 2015-04-20 DIAGNOSIS — K5790 Diverticulosis of intestine, part unspecified, without perforation or abscess without bleeding: Secondary | ICD-10-CM | POA: Diagnosis not present

## 2015-04-20 DIAGNOSIS — I10 Essential (primary) hypertension: Secondary | ICD-10-CM | POA: Diagnosis not present

## 2015-04-20 DIAGNOSIS — Z0000 Encounter for general adult medical examination without abnormal findings: Secondary | ICD-10-CM | POA: Diagnosis not present

## 2015-04-20 DIAGNOSIS — J45909 Unspecified asthma, uncomplicated: Secondary | ICD-10-CM | POA: Diagnosis not present

## 2015-04-20 DIAGNOSIS — G4730 Sleep apnea, unspecified: Secondary | ICD-10-CM | POA: Diagnosis not present

## 2015-04-20 DIAGNOSIS — E7800 Pure hypercholesterolemia, unspecified: Secondary | ICD-10-CM | POA: Diagnosis not present

## 2015-04-20 DIAGNOSIS — J309 Allergic rhinitis, unspecified: Secondary | ICD-10-CM | POA: Diagnosis not present

## 2015-04-20 DIAGNOSIS — M109 Gout, unspecified: Secondary | ICD-10-CM | POA: Diagnosis not present

## 2015-04-20 LAB — PSA: PSA: 3.75

## 2015-04-20 LAB — HEPATIC FUNCTION PANEL
ALT: 20 U/L (ref 10–40)
AST: 20 (ref 14–40)
Alkaline Phosphatase: 47 (ref 25–125)
Bilirubin, Total: 0.3

## 2015-04-20 LAB — LIPID PANEL
Cholesterol: 138 (ref 0–200)
HDL: 37 (ref 35–70)
LDL Cholesterol: 63
LDl/HDL Ratio: 3.7
Triglycerides: 192 — AB (ref 40–160)

## 2015-04-20 LAB — BASIC METABOLIC PANEL
BUN: 14 (ref 4–21)
CO2: 27 — AB (ref 13–22)
Chloride: 103 (ref 99–108)
Creatinine: 1.1 (ref 0.6–1.3)
Glucose: 95
Potassium: 4.2 mEq/L (ref 3.5–5.1)
Sodium: 138 (ref 137–147)

## 2015-04-20 LAB — CBC AND DIFFERENTIAL
HCT: 45 (ref 41–53)
Hemoglobin: 15.4 (ref 13.5–17.5)
Platelets: 210 10*3/uL (ref 150–400)
WBC: 6.1

## 2015-04-20 LAB — CBC: RBC: 4.57 (ref 3.87–5.11)

## 2015-04-20 LAB — COMPREHENSIVE METABOLIC PANEL
Albumin: 4 (ref 3.5–5.0)
Calcium: 9.2 (ref 8.7–10.7)

## 2015-07-28 DIAGNOSIS — H401113 Primary open-angle glaucoma, right eye, severe stage: Secondary | ICD-10-CM | POA: Diagnosis not present

## 2015-08-21 DIAGNOSIS — H35372 Puckering of macula, left eye: Secondary | ICD-10-CM | POA: Diagnosis not present

## 2015-08-21 DIAGNOSIS — H401132 Primary open-angle glaucoma, bilateral, moderate stage: Secondary | ICD-10-CM | POA: Diagnosis not present

## 2015-08-21 DIAGNOSIS — H04123 Dry eye syndrome of bilateral lacrimal glands: Secondary | ICD-10-CM | POA: Diagnosis not present

## 2015-08-21 DIAGNOSIS — H2511 Age-related nuclear cataract, right eye: Secondary | ICD-10-CM | POA: Diagnosis not present

## 2015-10-29 DIAGNOSIS — Z23 Encounter for immunization: Secondary | ICD-10-CM | POA: Diagnosis not present

## 2015-10-29 DIAGNOSIS — K5790 Diverticulosis of intestine, part unspecified, without perforation or abscess without bleeding: Secondary | ICD-10-CM | POA: Diagnosis not present

## 2015-10-29 DIAGNOSIS — I10 Essential (primary) hypertension: Secondary | ICD-10-CM | POA: Diagnosis not present

## 2015-10-29 DIAGNOSIS — E7800 Pure hypercholesterolemia, unspecified: Secondary | ICD-10-CM | POA: Diagnosis not present

## 2015-10-29 DIAGNOSIS — M109 Gout, unspecified: Secondary | ICD-10-CM | POA: Diagnosis not present

## 2015-10-30 LAB — LIPID PANEL
Cholesterol: 140 (ref 0–200)
HDL: 37 (ref 35–70)
LDL Cholesterol: 75
LDl/HDL Ratio: 3.8
Triglycerides: 138 (ref 40–160)

## 2015-10-30 LAB — COMPREHENSIVE METABOLIC PANEL
Albumin: 4 (ref 3.5–5.0)
Calcium: 9.3 (ref 8.7–10.7)

## 2015-10-30 LAB — BASIC METABOLIC PANEL
BUN: 12 (ref 4–21)
CO2: 23 — AB (ref 13–22)
Chloride: 104 (ref 99–108)
Creatinine: 1 (ref 0.6–1.3)
Glucose: 90
Potassium: 4.4 mEq/L (ref 3.5–5.1)
Sodium: 137 (ref 137–147)

## 2015-10-30 LAB — HEPATIC FUNCTION PANEL
ALT: 17 U/L (ref 10–40)
AST: 19 (ref 14–40)
Alkaline Phosphatase: 42 (ref 25–125)
Bilirubin, Total: 0.3

## 2016-02-23 DIAGNOSIS — H2511 Age-related nuclear cataract, right eye: Secondary | ICD-10-CM | POA: Diagnosis not present

## 2016-02-23 DIAGNOSIS — H04123 Dry eye syndrome of bilateral lacrimal glands: Secondary | ICD-10-CM | POA: Diagnosis not present

## 2016-02-23 DIAGNOSIS — H401132 Primary open-angle glaucoma, bilateral, moderate stage: Secondary | ICD-10-CM | POA: Diagnosis not present

## 2016-02-23 DIAGNOSIS — H35372 Puckering of macula, left eye: Secondary | ICD-10-CM | POA: Diagnosis not present

## 2016-05-04 DIAGNOSIS — E7800 Pure hypercholesterolemia, unspecified: Secondary | ICD-10-CM | POA: Diagnosis not present

## 2016-05-04 DIAGNOSIS — M109 Gout, unspecified: Secondary | ICD-10-CM | POA: Diagnosis not present

## 2016-05-04 DIAGNOSIS — K5790 Diverticulosis of intestine, part unspecified, without perforation or abscess without bleeding: Secondary | ICD-10-CM | POA: Diagnosis not present

## 2016-05-04 DIAGNOSIS — Z125 Encounter for screening for malignant neoplasm of prostate: Secondary | ICD-10-CM | POA: Diagnosis not present

## 2016-05-04 DIAGNOSIS — Z0000 Encounter for general adult medical examination without abnormal findings: Secondary | ICD-10-CM | POA: Diagnosis not present

## 2016-05-04 DIAGNOSIS — I10 Essential (primary) hypertension: Secondary | ICD-10-CM | POA: Diagnosis not present

## 2016-05-05 LAB — HEPATIC FUNCTION PANEL
ALT: 21 U/L (ref 10–40)
AST: 21 (ref 14–40)
Alkaline Phosphatase: 51 (ref 25–125)
Bilirubin, Total: 0.4

## 2016-05-05 LAB — COMPREHENSIVE METABOLIC PANEL
Albumin: 4.6 (ref 3.5–5.0)
Calcium: 9.7 (ref 8.7–10.7)
Globulin: 2.5
eGFR: 78

## 2016-05-05 LAB — LIPID PANEL
Cholesterol: 183 (ref 0–200)
HDL: 48 (ref 35–70)
LDL Cholesterol: 107
LDl/HDL Ratio: 3.8
Triglycerides: 140 (ref 40–160)

## 2016-05-05 LAB — CBC AND DIFFERENTIAL
HCT: 47 (ref 41–53)
Hemoglobin: 16.2 (ref 13.5–17.5)
Neutrophils Absolute: 4.4
Platelets: 245 10*3/uL (ref 150–400)
WBC: 6.9

## 2016-05-05 LAB — BASIC METABOLIC PANEL
BUN: 12 (ref 4–21)
CO2: 24 — AB (ref 13–22)
Chloride: 98 — AB (ref 99–108)
Creatinine: 1 (ref 0.6–1.3)
Glucose: 89
Potassium: 4.5 mEq/L (ref 3.5–5.1)
Sodium: 138 (ref 137–147)

## 2016-05-05 LAB — PSA: PSA: 4.1

## 2016-05-05 LAB — CBC: RBC: 4.76 (ref 3.87–5.11)

## 2016-10-20 DIAGNOSIS — H401132 Primary open-angle glaucoma, bilateral, moderate stage: Secondary | ICD-10-CM | POA: Diagnosis not present

## 2016-10-20 DIAGNOSIS — H04123 Dry eye syndrome of bilateral lacrimal glands: Secondary | ICD-10-CM | POA: Diagnosis not present

## 2016-10-20 DIAGNOSIS — H2511 Age-related nuclear cataract, right eye: Secondary | ICD-10-CM | POA: Diagnosis not present

## 2016-10-20 DIAGNOSIS — H35372 Puckering of macula, left eye: Secondary | ICD-10-CM | POA: Diagnosis not present

## 2016-11-04 DIAGNOSIS — Z23 Encounter for immunization: Secondary | ICD-10-CM | POA: Diagnosis not present

## 2016-11-04 DIAGNOSIS — M109 Gout, unspecified: Secondary | ICD-10-CM | POA: Diagnosis not present

## 2016-11-04 DIAGNOSIS — R972 Elevated prostate specific antigen [PSA]: Secondary | ICD-10-CM | POA: Diagnosis not present

## 2016-11-04 DIAGNOSIS — I10 Essential (primary) hypertension: Secondary | ICD-10-CM | POA: Diagnosis not present

## 2016-11-04 DIAGNOSIS — E7800 Pure hypercholesterolemia, unspecified: Secondary | ICD-10-CM | POA: Diagnosis not present

## 2016-11-07 LAB — LIPID PANEL
Cholesterol: 157 (ref 0–200)
HDL: 41 (ref 35–70)
LDL Cholesterol: 92
LDl/HDL Ratio: 3.8
Triglycerides: 120 (ref 40–160)

## 2016-11-07 LAB — BASIC METABOLIC PANEL
BUN: 12 (ref 4–21)
CO2: 21 (ref 13–22)
Chloride: 103 (ref 99–108)
Creatinine: 0.8 (ref 0.6–1.3)
Glucose: 87
Potassium: 4.7 mEq/L (ref 3.5–5.1)
Sodium: 138 (ref 137–147)

## 2016-11-07 LAB — COMPREHENSIVE METABOLIC PANEL
Albumin: 4.4 (ref 3.5–5.0)
Calcium: 9.4 (ref 8.7–10.7)
Globulin: 2.3
eGFR: 89

## 2016-11-07 LAB — HEPATIC FUNCTION PANEL
ALT: 25 U/L (ref 10–40)
AST: 23 (ref 14–40)
Alkaline Phosphatase: 51 (ref 25–125)

## 2016-11-07 LAB — PSA: PSA: 4.6

## 2017-04-25 DIAGNOSIS — H35372 Puckering of macula, left eye: Secondary | ICD-10-CM | POA: Diagnosis not present

## 2017-04-25 DIAGNOSIS — H40031 Anatomical narrow angle, right eye: Secondary | ICD-10-CM | POA: Diagnosis not present

## 2017-04-25 DIAGNOSIS — H401132 Primary open-angle glaucoma, bilateral, moderate stage: Secondary | ICD-10-CM | POA: Diagnosis not present

## 2017-04-25 DIAGNOSIS — H2511 Age-related nuclear cataract, right eye: Secondary | ICD-10-CM | POA: Diagnosis not present

## 2017-04-25 DIAGNOSIS — H04123 Dry eye syndrome of bilateral lacrimal glands: Secondary | ICD-10-CM | POA: Diagnosis not present

## 2017-05-12 DIAGNOSIS — H60509 Unspecified acute noninfective otitis externa, unspecified ear: Secondary | ICD-10-CM | POA: Diagnosis not present

## 2017-05-12 DIAGNOSIS — H9192 Unspecified hearing loss, left ear: Secondary | ICD-10-CM | POA: Diagnosis not present

## 2017-05-12 DIAGNOSIS — H9202 Otalgia, left ear: Secondary | ICD-10-CM | POA: Diagnosis not present

## 2017-05-12 DIAGNOSIS — H6120 Impacted cerumen, unspecified ear: Secondary | ICD-10-CM | POA: Diagnosis not present

## 2017-06-05 DIAGNOSIS — R972 Elevated prostate specific antigen [PSA]: Secondary | ICD-10-CM | POA: Diagnosis not present

## 2017-06-05 DIAGNOSIS — R5383 Other fatigue: Secondary | ICD-10-CM | POA: Diagnosis not present

## 2017-06-05 DIAGNOSIS — M109 Gout, unspecified: Secondary | ICD-10-CM | POA: Diagnosis not present

## 2017-06-05 DIAGNOSIS — I10 Essential (primary) hypertension: Secondary | ICD-10-CM | POA: Diagnosis not present

## 2017-06-05 DIAGNOSIS — Z0000 Encounter for general adult medical examination without abnormal findings: Secondary | ICD-10-CM | POA: Diagnosis not present

## 2017-06-05 DIAGNOSIS — Z125 Encounter for screening for malignant neoplasm of prostate: Secondary | ICD-10-CM | POA: Diagnosis not present

## 2017-06-05 DIAGNOSIS — E7800 Pure hypercholesterolemia, unspecified: Secondary | ICD-10-CM | POA: Diagnosis not present

## 2017-06-06 LAB — BASIC METABOLIC PANEL
BUN: 11 (ref 4–21)
CO2: 23 — AB (ref 13–22)
Chloride: 99 (ref 99–108)
Creatinine: 1 (ref 0.6–1.3)
Glucose: 95
Potassium: 4.6 mEq/L (ref 3.5–5.1)
Sodium: 137 (ref 137–147)

## 2017-06-06 LAB — COMPREHENSIVE METABOLIC PANEL
Albumin: 4.6 (ref 3.5–5.0)
Calcium: 10 (ref 8.7–10.7)
Globulin: 2.2
eGFR: 74

## 2017-06-06 LAB — CBC AND DIFFERENTIAL
HCT: 49 (ref 41–53)
Hemoglobin: 16.3 (ref 13.5–17.5)
Neutrophils Absolute: 4.3
Platelets: 220 10*3/uL (ref 150–400)
WBC: 6.6

## 2017-06-06 LAB — PSA: PSA: 4.7

## 2017-06-06 LAB — HEPATIC FUNCTION PANEL
ALT: 23 U/L (ref 10–40)
AST: 22 (ref 14–40)
Alkaline Phosphatase: 53 (ref 25–125)
Bilirubin, Total: 0.5

## 2017-06-06 LAB — LIPID PANEL
Cholesterol: 170 (ref 0–200)
HDL: 44 (ref 35–70)
LDL Cholesterol: 95
LDl/HDL Ratio: 3.9
Triglycerides: 156 (ref 40–160)

## 2017-06-06 LAB — CBC: RBC: 4.79 (ref 3.87–5.11)

## 2017-10-19 DIAGNOSIS — H40031 Anatomical narrow angle, right eye: Secondary | ICD-10-CM | POA: Diagnosis not present

## 2017-10-27 DIAGNOSIS — Z23 Encounter for immunization: Secondary | ICD-10-CM | POA: Diagnosis not present

## 2017-10-30 DIAGNOSIS — H401132 Primary open-angle glaucoma, bilateral, moderate stage: Secondary | ICD-10-CM | POA: Diagnosis not present

## 2017-10-30 DIAGNOSIS — H2511 Age-related nuclear cataract, right eye: Secondary | ICD-10-CM | POA: Diagnosis not present

## 2017-10-30 DIAGNOSIS — H35372 Puckering of macula, left eye: Secondary | ICD-10-CM | POA: Diagnosis not present

## 2017-12-11 DIAGNOSIS — R972 Elevated prostate specific antigen [PSA]: Secondary | ICD-10-CM | POA: Diagnosis not present

## 2017-12-11 DIAGNOSIS — M109 Gout, unspecified: Secondary | ICD-10-CM | POA: Diagnosis not present

## 2017-12-11 DIAGNOSIS — E7800 Pure hypercholesterolemia, unspecified: Secondary | ICD-10-CM | POA: Diagnosis not present

## 2017-12-11 DIAGNOSIS — I10 Essential (primary) hypertension: Secondary | ICD-10-CM | POA: Diagnosis not present

## 2017-12-12 LAB — COMPREHENSIVE METABOLIC PANEL
Albumin: 4.4 (ref 3.5–5.0)
Calcium: 9.6 (ref 8.7–10.7)
Globulin: 2.5
eGFR: 82

## 2017-12-12 LAB — HEPATIC FUNCTION PANEL
ALT: 16 U/L (ref 10–40)
AST: 18 (ref 14–40)
Alkaline Phosphatase: 57 (ref 25–125)
Bilirubin, Total: 0.4

## 2017-12-12 LAB — LIPID PANEL
Cholesterol: 154 (ref 0–200)
HDL: 39 (ref 35–70)
LDL Cholesterol: 94
LDl/HDL Ratio: 3.9
Triglycerides: 106 (ref 40–160)

## 2017-12-12 LAB — BASIC METABOLIC PANEL
BUN: 10 (ref 4–21)
CO2: 23 — AB (ref 13–22)
Chloride: 96 — AB (ref 99–108)
Creatinine: 0.9 (ref 0.6–1.3)
Glucose: 88
Potassium: 4.8 mEq/L (ref 3.5–5.1)
Sodium: 135 — AB (ref 137–147)

## 2017-12-12 LAB — PSA: PSA: 5.1

## 2018-04-09 DIAGNOSIS — H35372 Puckering of macula, left eye: Secondary | ICD-10-CM | POA: Diagnosis not present

## 2018-04-09 DIAGNOSIS — H2511 Age-related nuclear cataract, right eye: Secondary | ICD-10-CM | POA: Diagnosis not present

## 2018-04-09 DIAGNOSIS — H401132 Primary open-angle glaucoma, bilateral, moderate stage: Secondary | ICD-10-CM | POA: Diagnosis not present

## 2018-06-14 DIAGNOSIS — R972 Elevated prostate specific antigen [PSA]: Secondary | ICD-10-CM | POA: Diagnosis not present

## 2018-06-14 DIAGNOSIS — M109 Gout, unspecified: Secondary | ICD-10-CM | POA: Diagnosis not present

## 2018-06-14 DIAGNOSIS — I10 Essential (primary) hypertension: Secondary | ICD-10-CM | POA: Diagnosis not present

## 2018-06-14 DIAGNOSIS — Z0000 Encounter for general adult medical examination without abnormal findings: Secondary | ICD-10-CM | POA: Diagnosis not present

## 2018-06-14 DIAGNOSIS — E7800 Pure hypercholesterolemia, unspecified: Secondary | ICD-10-CM | POA: Diagnosis not present

## 2018-06-14 DIAGNOSIS — Z125 Encounter for screening for malignant neoplasm of prostate: Secondary | ICD-10-CM | POA: Diagnosis not present

## 2018-06-14 DIAGNOSIS — R5383 Other fatigue: Secondary | ICD-10-CM | POA: Diagnosis not present

## 2018-06-15 LAB — LIPID PANEL
Cholesterol: 168 (ref 0–200)
HDL: 42 (ref 35–70)
LDL Cholesterol: 103
LDl/HDL Ratio: 4
Triglycerides: 116 (ref 40–160)

## 2018-06-15 LAB — CBC: RBC: 4.81 (ref 3.87–5.11)

## 2018-06-15 LAB — HEPATIC FUNCTION PANEL
ALT: 20 U/L (ref 10–40)
AST: 19 (ref 14–40)
Alkaline Phosphatase: 61 (ref 25–125)
Bilirubin, Total: 0.3

## 2018-06-15 LAB — BASIC METABOLIC PANEL
BUN: 10 (ref 4–21)
CO2: 22 (ref 13–22)
Chloride: 95 — AB (ref 99–108)
Creatinine: 1 (ref 0.6–1.3)
Glucose: 90
Potassium: 4.8 mEq/L (ref 3.5–5.1)
Sodium: 134 — AB (ref 137–147)

## 2018-06-15 LAB — CBC AND DIFFERENTIAL
HCT: 47 (ref 41–53)
Hemoglobin: 16.5 (ref 13.5–17.5)
Neutrophils Absolute: 6.9
Platelets: 312 10*3/uL (ref 150–400)
WBC: 9.8

## 2018-06-15 LAB — COMPREHENSIVE METABOLIC PANEL
Albumin: 4.9 (ref 3.5–5.0)
Calcium: 10.1 (ref 8.7–10.7)
Globulin: 2.3
eGFR: 72

## 2018-06-15 LAB — PSA: PSA: 5.6

## 2018-08-13 ENCOUNTER — Other Ambulatory Visit: Payer: Self-pay

## 2018-08-13 ENCOUNTER — Ambulatory Visit (INDEPENDENT_AMBULATORY_CARE_PROVIDER_SITE_OTHER): Payer: Medicare HMO

## 2018-08-13 ENCOUNTER — Other Ambulatory Visit: Payer: Self-pay | Admitting: Unknown Physician Specialty

## 2018-08-13 DIAGNOSIS — M79672 Pain in left foot: Secondary | ICD-10-CM | POA: Diagnosis not present

## 2018-08-13 DIAGNOSIS — M7989 Other specified soft tissue disorders: Secondary | ICD-10-CM | POA: Diagnosis not present

## 2018-08-13 DIAGNOSIS — I10 Essential (primary) hypertension: Secondary | ICD-10-CM | POA: Diagnosis not present

## 2018-09-06 DIAGNOSIS — E790 Hyperuricemia without signs of inflammatory arthritis and tophaceous disease: Secondary | ICD-10-CM | POA: Diagnosis not present

## 2018-10-24 DIAGNOSIS — H40032 Anatomical narrow angle, left eye: Secondary | ICD-10-CM | POA: Diagnosis not present

## 2018-11-05 DIAGNOSIS — H2511 Age-related nuclear cataract, right eye: Secondary | ICD-10-CM | POA: Diagnosis not present

## 2018-11-05 DIAGNOSIS — H35372 Puckering of macula, left eye: Secondary | ICD-10-CM | POA: Diagnosis not present

## 2018-11-05 DIAGNOSIS — H401132 Primary open-angle glaucoma, bilateral, moderate stage: Secondary | ICD-10-CM | POA: Diagnosis not present

## 2018-12-04 DIAGNOSIS — Z23 Encounter for immunization: Secondary | ICD-10-CM | POA: Diagnosis not present

## 2018-12-18 DIAGNOSIS — E7800 Pure hypercholesterolemia, unspecified: Secondary | ICD-10-CM | POA: Diagnosis not present

## 2018-12-18 DIAGNOSIS — I10 Essential (primary) hypertension: Secondary | ICD-10-CM | POA: Diagnosis not present

## 2018-12-18 DIAGNOSIS — N529 Male erectile dysfunction, unspecified: Secondary | ICD-10-CM | POA: Diagnosis not present

## 2018-12-18 DIAGNOSIS — M109 Gout, unspecified: Secondary | ICD-10-CM | POA: Diagnosis not present

## 2018-12-20 DIAGNOSIS — E7800 Pure hypercholesterolemia, unspecified: Secondary | ICD-10-CM | POA: Diagnosis not present

## 2018-12-20 DIAGNOSIS — I10 Essential (primary) hypertension: Secondary | ICD-10-CM | POA: Diagnosis not present

## 2018-12-20 DIAGNOSIS — R972 Elevated prostate specific antigen [PSA]: Secondary | ICD-10-CM | POA: Diagnosis not present

## 2018-12-21 LAB — HEPATIC FUNCTION PANEL
ALT: 17 U/L (ref 10–40)
AST: 19 (ref 14–40)
Alkaline Phosphatase: 71 (ref 25–125)
Bilirubin, Total: 0.5

## 2018-12-21 LAB — LIPID PANEL
Cholesterol: 150 (ref 0–200)
HDL: 43 (ref 35–70)
LDL Cholesterol: 84
LDl/HDL Ratio: 3.5
Triglycerides: 128 (ref 40–160)

## 2018-12-21 LAB — PSA: PSA: 5.7

## 2018-12-21 LAB — COMPREHENSIVE METABOLIC PANEL
Albumin: 4.5 (ref 3.5–5.0)
Calcium: 10.1 (ref 8.7–10.7)
Globulin: 2.8
eGFR: 74

## 2018-12-21 LAB — BASIC METABOLIC PANEL
BUN: 9 (ref 4–21)
CO2: 23 — AB (ref 13–22)
Chloride: 96 — AB (ref 99–108)
Creatinine: 1 (ref 0.6–1.3)
Glucose: 95
Potassium: 4.8 mEq/L (ref 3.5–5.1)
Sodium: 133 — AB (ref 137–147)

## 2019-04-30 DIAGNOSIS — H04123 Dry eye syndrome of bilateral lacrimal glands: Secondary | ICD-10-CM | POA: Diagnosis not present

## 2019-04-30 DIAGNOSIS — H2511 Age-related nuclear cataract, right eye: Secondary | ICD-10-CM | POA: Diagnosis not present

## 2019-04-30 DIAGNOSIS — H401132 Primary open-angle glaucoma, bilateral, moderate stage: Secondary | ICD-10-CM | POA: Diagnosis not present

## 2019-06-21 DIAGNOSIS — I10 Essential (primary) hypertension: Secondary | ICD-10-CM | POA: Diagnosis not present

## 2019-06-21 DIAGNOSIS — N529 Male erectile dysfunction, unspecified: Secondary | ICD-10-CM | POA: Diagnosis not present

## 2019-06-21 DIAGNOSIS — Z0000 Encounter for general adult medical examination without abnormal findings: Secondary | ICD-10-CM | POA: Diagnosis not present

## 2019-06-21 DIAGNOSIS — Z125 Encounter for screening for malignant neoplasm of prostate: Secondary | ICD-10-CM | POA: Diagnosis not present

## 2019-06-21 DIAGNOSIS — M109 Gout, unspecified: Secondary | ICD-10-CM | POA: Diagnosis not present

## 2019-06-21 DIAGNOSIS — R972 Elevated prostate specific antigen [PSA]: Secondary | ICD-10-CM | POA: Diagnosis not present

## 2019-06-21 DIAGNOSIS — R5383 Other fatigue: Secondary | ICD-10-CM | POA: Diagnosis not present

## 2019-06-21 DIAGNOSIS — E7800 Pure hypercholesterolemia, unspecified: Secondary | ICD-10-CM | POA: Diagnosis not present

## 2019-06-22 LAB — HEPATIC FUNCTION PANEL
ALT: 31 U/L (ref 10–40)
AST: 25 (ref 14–40)
Alkaline Phosphatase: 64 (ref 25–125)
Bilirubin, Total: 0.4

## 2019-06-22 LAB — CBC AND DIFFERENTIAL
HCT: 46 (ref 41–53)
Hemoglobin: 16.3 (ref 13.5–17.5)
Neutrophils Absolute: 3.7
Platelets: 230 10*3/uL (ref 150–400)
WBC: 6.2

## 2019-06-22 LAB — LIPID PANEL
Cholesterol: 152 (ref 0–200)
HDL: 45 (ref 35–70)
LDL Cholesterol: 86
LDl/HDL Ratio: 3.4
Triglycerides: 113 (ref 40–160)

## 2019-06-22 LAB — CBC: RBC: 4.7 (ref 3.87–5.11)

## 2019-06-22 LAB — COMPREHENSIVE METABOLIC PANEL
Albumin: 4.7 (ref 3.5–5.0)
Calcium: 9.8 (ref 8.7–10.7)
Globulin: 2.3
eGFR: 83

## 2019-06-22 LAB — BASIC METABOLIC PANEL
BUN: 10 (ref 4–21)
CO2: 22 (ref 13–22)
Chloride: 101 (ref 99–108)
Creatinine: 0.9 (ref 0.6–1.3)
Glucose: 90
Potassium: 4.6 mEq/L (ref 3.5–5.1)
Sodium: 136 — AB (ref 137–147)

## 2019-06-22 LAB — PSA: PSA: 5

## 2019-06-27 DIAGNOSIS — H2511 Age-related nuclear cataract, right eye: Secondary | ICD-10-CM | POA: Diagnosis not present

## 2019-06-27 DIAGNOSIS — H04123 Dry eye syndrome of bilateral lacrimal glands: Secondary | ICD-10-CM | POA: Diagnosis not present

## 2019-06-27 DIAGNOSIS — H401132 Primary open-angle glaucoma, bilateral, moderate stage: Secondary | ICD-10-CM | POA: Diagnosis not present

## 2019-08-07 DIAGNOSIS — H401112 Primary open-angle glaucoma, right eye, moderate stage: Secondary | ICD-10-CM | POA: Diagnosis not present

## 2019-08-07 DIAGNOSIS — H2511 Age-related nuclear cataract, right eye: Secondary | ICD-10-CM | POA: Diagnosis not present

## 2019-08-07 DIAGNOSIS — H401111 Primary open-angle glaucoma, right eye, mild stage: Secondary | ICD-10-CM | POA: Diagnosis not present

## 2019-11-14 DIAGNOSIS — Z23 Encounter for immunization: Secondary | ICD-10-CM | POA: Diagnosis not present

## 2019-11-20 DIAGNOSIS — H401132 Primary open-angle glaucoma, bilateral, moderate stage: Secondary | ICD-10-CM | POA: Diagnosis not present

## 2019-11-20 DIAGNOSIS — Z961 Presence of intraocular lens: Secondary | ICD-10-CM | POA: Diagnosis not present

## 2019-12-03 DIAGNOSIS — I10 Essential (primary) hypertension: Secondary | ICD-10-CM | POA: Diagnosis not present

## 2019-12-03 DIAGNOSIS — N529 Male erectile dysfunction, unspecified: Secondary | ICD-10-CM | POA: Diagnosis not present

## 2019-12-03 DIAGNOSIS — M109 Gout, unspecified: Secondary | ICD-10-CM | POA: Diagnosis not present

## 2019-12-03 DIAGNOSIS — E7800 Pure hypercholesterolemia, unspecified: Secondary | ICD-10-CM | POA: Diagnosis not present

## 2019-12-05 DIAGNOSIS — R972 Elevated prostate specific antigen [PSA]: Secondary | ICD-10-CM | POA: Diagnosis not present

## 2019-12-05 DIAGNOSIS — E7800 Pure hypercholesterolemia, unspecified: Secondary | ICD-10-CM | POA: Diagnosis not present

## 2019-12-05 DIAGNOSIS — I10 Essential (primary) hypertension: Secondary | ICD-10-CM | POA: Diagnosis not present

## 2019-12-05 LAB — CBC: RBC: 4.7 (ref 3.87–5.11)

## 2019-12-05 LAB — CBC AND DIFFERENTIAL
HCT: 46 (ref 41–53)
Hemoglobin: 16.3 (ref 13.5–17.5)
Neutrophils Absolute: 3.7
Platelets: 230 10*3/uL (ref 150–400)
WBC: 6.2

## 2019-12-05 LAB — BASIC METABOLIC PANEL
BUN: 10 (ref 4–21)
CO2: 22 (ref 13–22)
Chloride: 101 (ref 99–108)
Creatinine: 0.9 (ref 0.6–1.3)
Glucose: 90
Potassium: 4.6 mEq/L (ref 3.5–5.1)
Sodium: 136 — AB (ref 137–147)

## 2019-12-05 LAB — COMPREHENSIVE METABOLIC PANEL
Albumin: 4.7 (ref 3.5–5.0)
Calcium: 9.8 (ref 8.7–10.7)
Globulin: 2.3
eGFR: 83

## 2019-12-05 LAB — PSA: PSA: 8.1

## 2019-12-06 LAB — LIPID PANEL
Cholesterol: 174 (ref 0–200)
HDL: 40 (ref 35–70)
LDL Cholesterol: 106
LDl/HDL Ratio: 4.4
Triglycerides: 157 (ref 40–160)

## 2019-12-06 LAB — COMPREHENSIVE METABOLIC PANEL
Albumin: 4.5 (ref 3.5–5.0)
Calcium: 9.7 (ref 8.7–10.7)
Globulin: 2.3
eGFR: 70

## 2019-12-06 LAB — HEPATIC FUNCTION PANEL
ALT: 22 U/L (ref 10–40)
AST: 21 (ref 14–40)
Alkaline Phosphatase: 58 (ref 25–125)
Bilirubin, Total: 0.5

## 2019-12-06 LAB — BASIC METABOLIC PANEL
BUN: 10 (ref 4–21)
CO2: 23 — AB (ref 13–22)
Chloride: 99 (ref 99–108)
Creatinine: 1 (ref 0.6–1.3)
Glucose: 92
Potassium: 4.9 mEq/L (ref 3.5–5.1)
Sodium: 135 — AB (ref 137–147)

## 2020-06-10 LAB — CBC AND DIFFERENTIAL
HCT: 47 (ref 41–53)
Hemoglobin: 16.5 (ref 13.5–17.5)
Neutrophils Absolute: 4
Platelets: 239 10*3/uL (ref 150–400)
WBC: 6.3

## 2020-06-10 LAB — BASIC METABOLIC PANEL
BUN: 11 (ref 4–21)
CO2: 21 (ref 13–22)
Chloride: 96 — AB (ref 99–108)
Creatinine: 1 (ref 0.6–1.3)
Glucose: 88
Potassium: 4.8 mEq/L (ref 3.5–5.1)
Sodium: 139 (ref 137–147)

## 2020-06-10 LAB — LIPID PANEL
Cholesterol: 166 (ref 0–200)
HDL: 43 (ref 35–70)
LDL Cholesterol: 98
LDl/HDL Ratio: 3.9
Triglycerides: 140 (ref 40–160)

## 2020-06-10 LAB — COMPREHENSIVE METABOLIC PANEL
Albumin: 4.4 (ref 3.5–5.0)
Calcium: 9.7 (ref 8.7–10.7)
Globulin: 2.5
eGFR: 77

## 2020-06-10 LAB — HEPATIC FUNCTION PANEL
ALT: 19 U/L (ref 10–40)
AST: 20 (ref 14–40)
Alkaline Phosphatase: 54 (ref 25–125)
Bilirubin, Total: 0.6

## 2020-06-10 LAB — CBC: RBC: 4.83 (ref 3.87–5.11)

## 2020-12-16 LAB — LIPID PANEL
Cholesterol: 179 (ref 0–200)
HDL: 44 (ref 35–70)
LDL Cholesterol: 109
LDl/HDL Ratio: 4.1
Triglycerides: 148 (ref 40–160)

## 2020-12-16 LAB — BASIC METABOLIC PANEL
BUN: 11 (ref 4–21)
CO2: 24 — AB (ref 13–22)
Chloride: 96 — AB (ref 99–108)
Creatinine: 1 (ref 0.6–1.3)
Glucose: 91
Potassium: 4.8 mEq/L (ref 3.5–5.1)

## 2020-12-16 LAB — COMPREHENSIVE METABOLIC PANEL
Albumin: 4.9 (ref 3.5–5.0)
Calcium: 10.1 (ref 8.7–10.7)
Globulin: 2.4
eGFR: 77

## 2020-12-16 LAB — HEPATIC FUNCTION PANEL
ALT: 20 U/L (ref 10–40)
AST: 22 (ref 14–40)
Alkaline Phosphatase: 59 (ref 25–125)
Bilirubin, Total: 0.6

## 2020-12-16 LAB — PSA: PSA: 6.6

## 2021-02-18 IMAGING — DX LEFT FOOT - COMPLETE 3+ VIEW
3 series · 3 of 3 positions shown · non-contrast
Comparison: None.

CLINICAL DATA: Pain to LEFT first MTP joint for 1 and half weeks.
No known injury.

EXAM:
LEFT FOOT - COMPLETE 3+ VIEW

[foot ap]
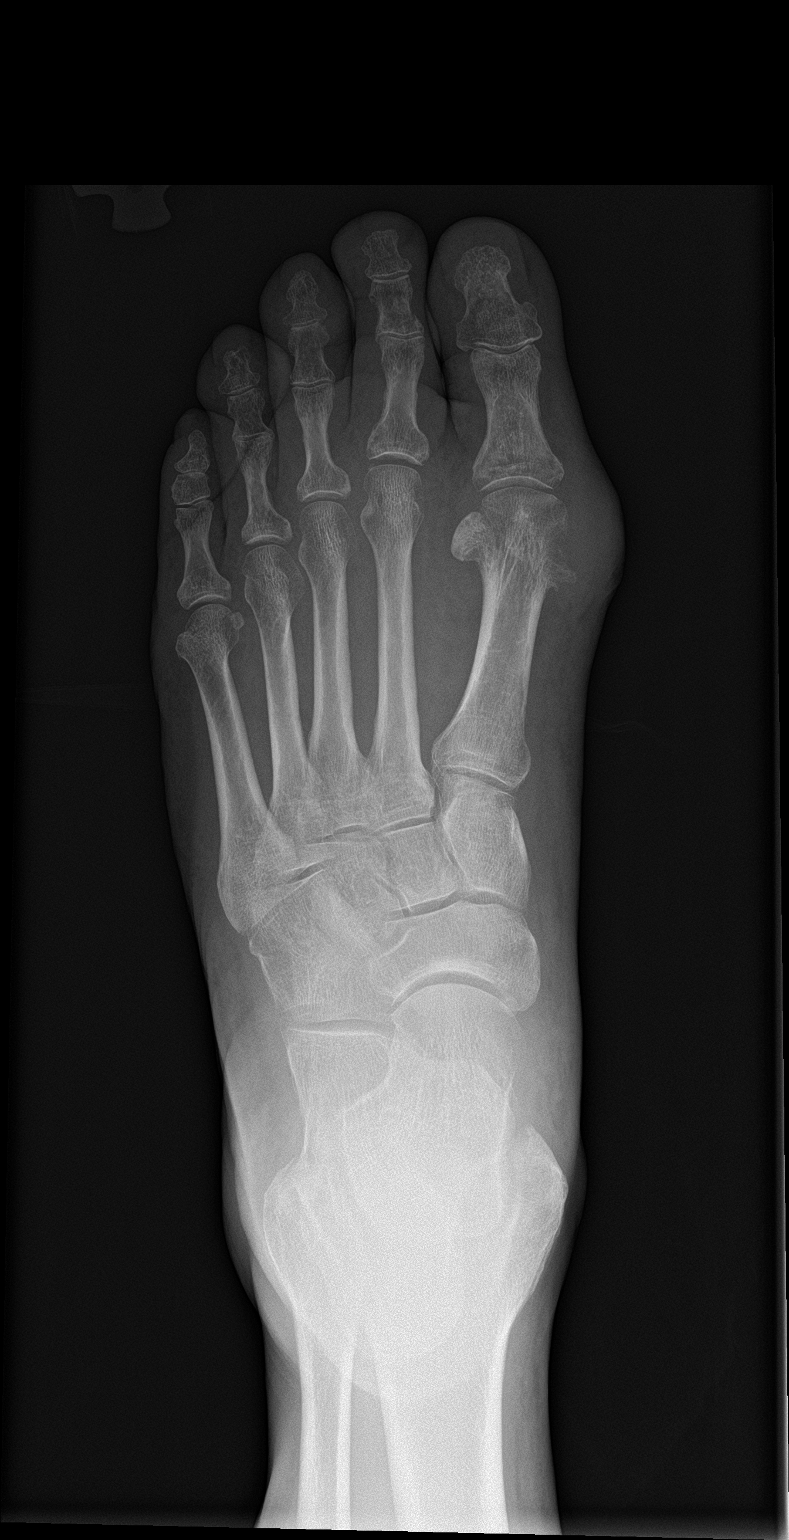

[foot obl]
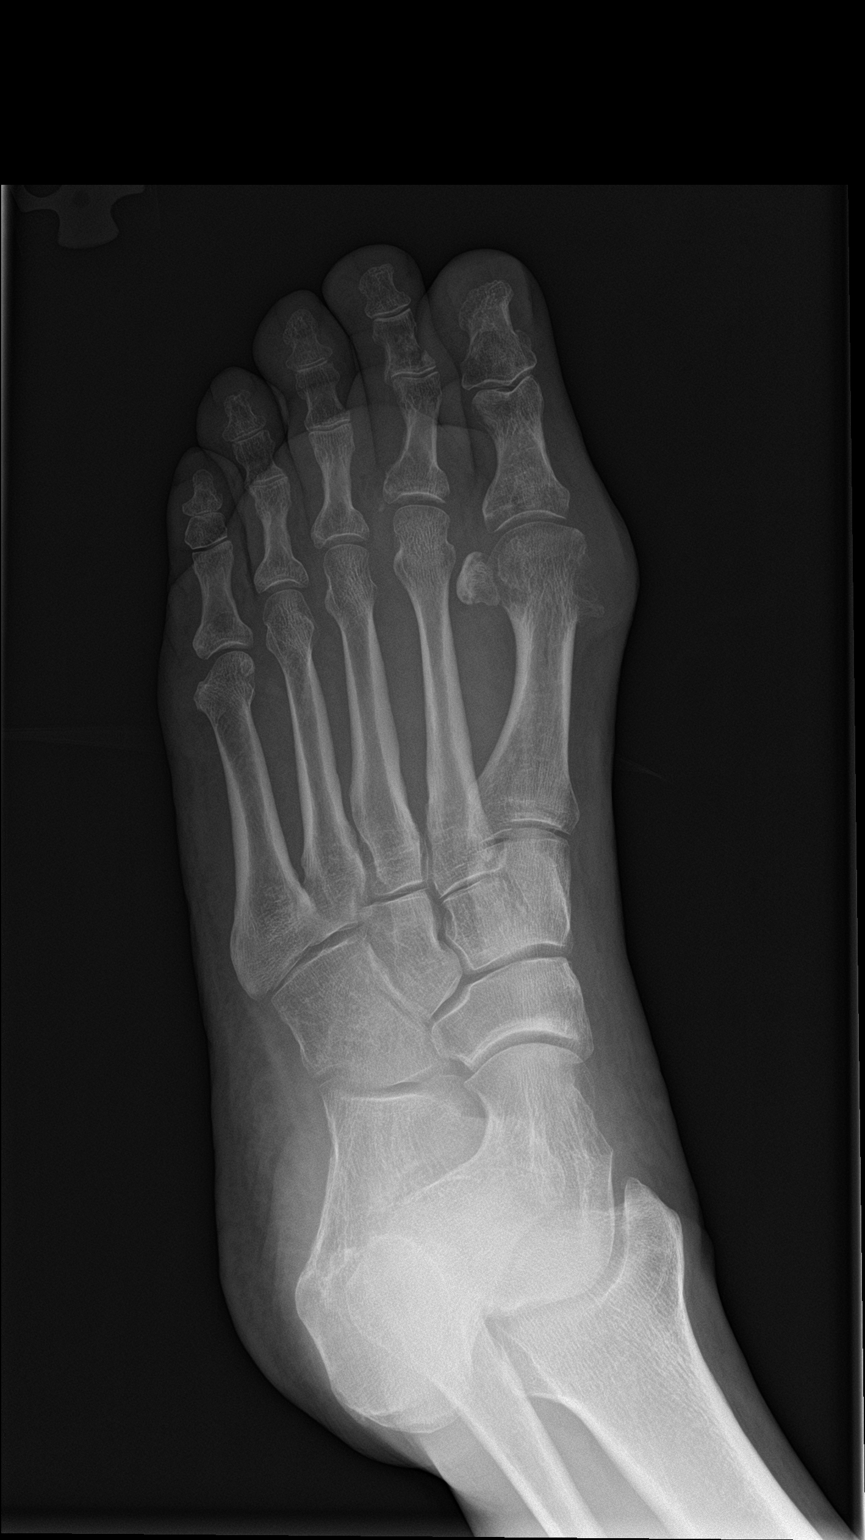

[foot lat]
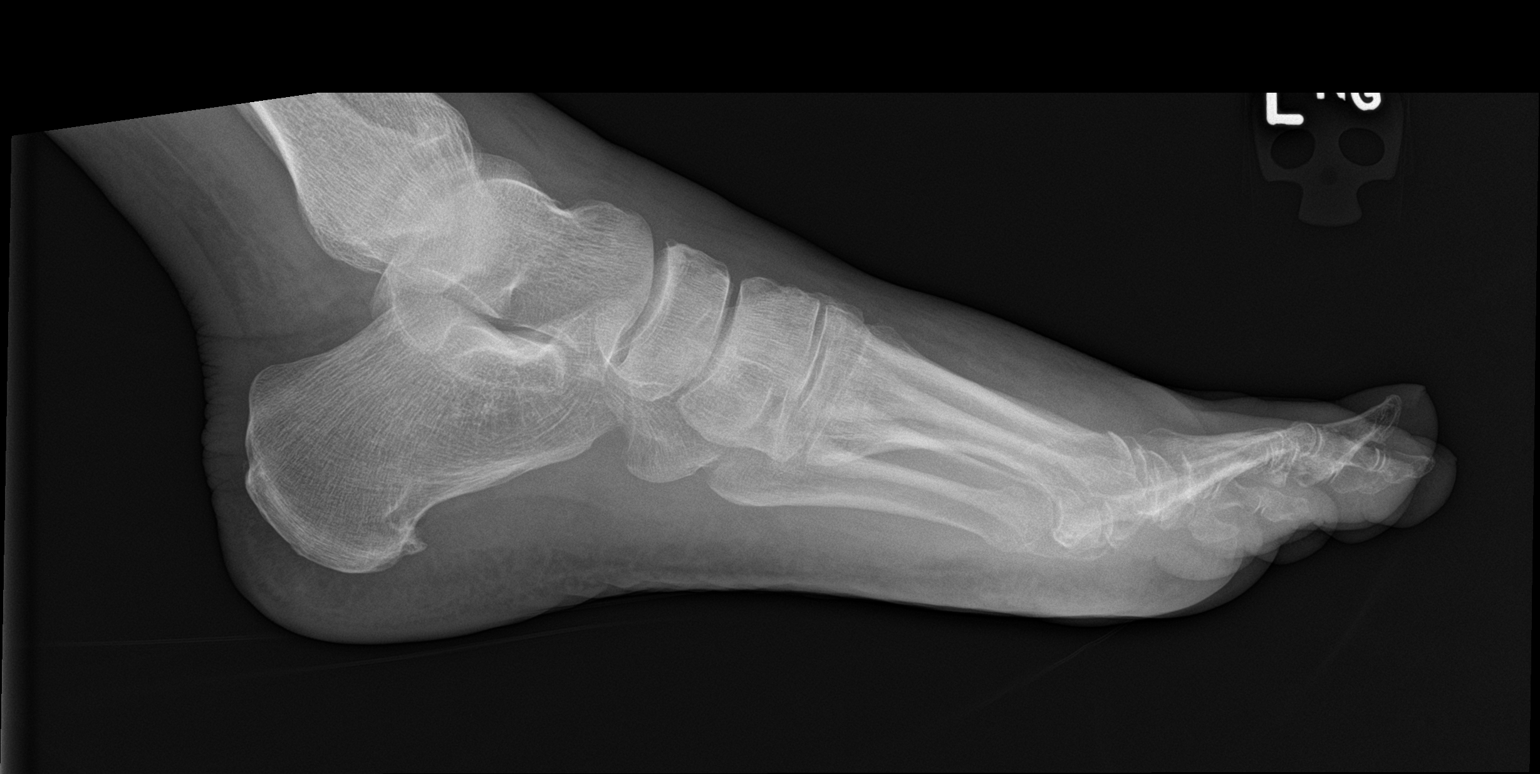

[3 of 3 positions shown; findings below may reference images not displayed]

FINDINGS: Chronic appearing erosion at the first metatarsal head, with
overlying soft tissue thickening suggesting gout. Osseous structures
of the foot are otherwise unremarkable. No fracture line or
displaced fracture fragment seen.
IMPRESSION: Chronic appearing erosion at the first metatarsal head, with
overlying soft tissue thickening, suggesting gout.

## 2021-06-18 LAB — BASIC METABOLIC PANEL
BUN: 12 (ref 4–21)
CO2: 22 (ref 13–22)
Chloride: 104 (ref 99–108)
Creatinine: 1.1 (ref 0.6–1.3)
Glucose: 95
Potassium: 4.4 mEq/L (ref 3.5–5.1)
Sodium: 140 (ref 137–147)

## 2021-06-18 LAB — LIPID PANEL
Cholesterol: 160 (ref 0–200)
HDL: 40 (ref 35–70)
LDL Cholesterol: 96
LDl/HDL Ratio: 4
Triglycerides: 132 (ref 40–160)

## 2021-06-18 LAB — CBC AND DIFFERENTIAL
HCT: 47 (ref 41–53)
Hemoglobin: 16.5 (ref 13.5–17.5)
Neutrophils Absolute: 4.7
Platelets: 237 10*3/uL (ref 150–400)
WBC: 6.8

## 2021-06-18 LAB — COMPREHENSIVE METABOLIC PANEL
Albumin: 4.4 (ref 3.5–5.0)
Calcium: 9.5 (ref 8.7–10.7)
Globulin: 2.6
eGFR: 70

## 2021-06-18 LAB — HEPATIC FUNCTION PANEL
ALT: 17 U/L (ref 10–40)
AST: 15 (ref 14–40)
Alkaline Phosphatase: 56 (ref 25–125)
Bilirubin, Total: 0.5

## 2021-06-18 LAB — CBC: RBC: 4.8 (ref 3.87–5.11)

## 2021-10-19 ENCOUNTER — Ambulatory Visit (INDEPENDENT_AMBULATORY_CARE_PROVIDER_SITE_OTHER): Payer: Medicare HMO | Admitting: Family Medicine

## 2021-10-19 ENCOUNTER — Encounter: Payer: Self-pay | Admitting: Family Medicine

## 2021-10-19 VITALS — BP 138/82 | HR 78 | Temp 97.6°F | Ht 65.5 in | Wt 182.0 lb

## 2021-10-19 DIAGNOSIS — I10 Essential (primary) hypertension: Secondary | ICD-10-CM

## 2021-10-19 DIAGNOSIS — E785 Hyperlipidemia, unspecified: Secondary | ICD-10-CM

## 2021-10-19 DIAGNOSIS — Z23 Encounter for immunization: Secondary | ICD-10-CM

## 2021-10-19 DIAGNOSIS — N529 Male erectile dysfunction, unspecified: Secondary | ICD-10-CM

## 2021-10-19 MED ORDER — AMLODIPINE BESYLATE 5 MG PO TABS: 5.0000 mg | ORAL_TABLET | Freq: Every day | ORAL | 3 refills | Status: DC

## 2021-10-19 MED ORDER — ENALAPRIL MALEATE 20 MG PO TABS: 20.0000 mg | ORAL_TABLET | Freq: Two times a day (BID) | ORAL | 3 refills | Status: DC

## 2021-10-19 MED ORDER — SIMVASTATIN 20 MG PO TABS: 20.0000 mg | ORAL_TABLET | Freq: Every day | ORAL | 3 refills | Status: DC

## 2021-10-19 NOTE — Assessment & Plan Note (Signed)
Stable on current medication, the patient does not remember the name of it, patient to call the office with the medication

## 2021-10-19 NOTE — Assessment & Plan Note (Signed)
Refill simvastatin 20 mg Follow-up for restratification blood work

## 2021-10-19 NOTE — Assessment & Plan Note (Signed)
Asymptomatic, stable Refill medications enalapril 20 mg twice daily, lipid 5 mg Follow-up for blood work fasting

## 2021-10-19 NOTE — Patient Instructions (Signed)
We refilled your medications.  

## 2021-10-19 NOTE — Progress Notes (Addendum)
Assessment/Plan:   Problem List Items Addressed This Visit       Cardiovascular and Mediastinum   Essential hypertension    Asymptomatic, stable Refill medications enalapril 20 mg twice daily, lipid 5 mg Follow-up for blood work fasting      Relevant Medications   amLODipine (NORVASC) 5 MG tablet   enalapril (VASOTEC) 20 MG tablet   simvastatin (ZOCOR) 20 MG tablet     Other   Hyperlipidemia    Refill simvastatin 20 mg Follow-up for restratification blood work      Relevant Medications   amLODipine (NORVASC) 5 MG tablet   enalapril (VASOTEC) 20 MG tablet   simvastatin (ZOCOR) 20 MG tablet   Erectile dysfunction    Stable on current medication, the patient does not remember the name of it, patient to call the office with the medication      Other Visit Diagnoses     Need for influenza vaccination    -  Primary   Relevant Orders   Flu Vaccine QUAD High Dose(Fluad) (Completed)          Subjective:  HPI:  Thomas Haynes is a 76 y.o. male who has Hyperlipidemia; Essential hypertension; and Erectile dysfunction on their problem list..   He  has a past medical history of Hyperlipidemia and Hypertension.[name]   He presents with chief complaint of Establish Care (No concerns) .   Hyperlipidemia, established problem,  Current medication(s): Simvastatin 20 mg.  Compliant without side effects.  Reports last lipid was checked over 2 years ago.  ROS: No chest pain or shortness of breath. No myalgias.  Hypertension, established problem, Stable BP Readings from Last 3 Encounters:  10/19/21 138/82   Home BP monitoring: None Current Medications: Enalapril 20 mg twice daily and amlodipine 5 mg daily, compliant without side effects. Interim History: Reports not having any lab work in 2 years  ROS: Denies any chest pain, shortness of breath, dyspnea on exertion, leg edema.   Erectile Dysfunction: Patient complains of erectile dysfunction.  Onset of dysfunction was  several years ago and was gradual in onset.  Patient states the nature of difficulty is both attaining and maintaining erection. Full erections occur with intercourse when treated with a medication that patient does not remember.  The rest of his rectally administered.. Partial erections occur with intercourse. Libido is not affected. Risk factors for ED include antihypertensive medications, amlodipine enalipril. Patient denies history of cardiovascular disease and diabetes mellitus.  Patient's description of relationship w/partner good.  Previous treatment of ED includes rectally administered medication, does not remember name.   Denies any chest pain, shortness of breath, lower extremity swelling, headache, blurry vision, memory issues, constipation, diarrhea, urinary problems   History reviewed. No pertinent surgical history.  Outpatient Medications Prior to Visit  Medication Sig Dispense Refill   amLODipine (NORVASC) 5 MG tablet Take 5 mg by mouth daily.     enalapril (VASOTEC) 20 MG tablet Take 20 mg by mouth 2 (two) times daily.     simvastatin (ZOCOR) 20 MG tablet Take 20 mg by mouth at bedtime.     No facility-administered medications prior to visit.    History reviewed. No pertinent family history.  Social History   Socioeconomic History   Marital status: Married    Spouse name: Not on file   Number of children: Not on file   Years of education: Not on file   Highest education level: Not on file  Occupational History   Not on  file  Tobacco Use   Smoking status: Never    Passive exposure: Never   Smokeless tobacco: Never  Vaping Use   Vaping Use: Never used  Substance and Sexual Activity   Alcohol use: Yes    Comment: Occassionally   Drug use: Never   Sexual activity: Not on file  Other Topics Concern   Not on file  Social History Narrative   Not on file   Social Determinants of Health   Financial Resource Strain: Not on file  Food Insecurity: Not on file   Transportation Needs: Not on file  Physical Activity: Not on file  Stress: Not on file  Social Connections: Not on file  Intimate Partner Violence: Not on file                                                                                                 Objective:  Physical Exam: BP 138/82 (BP Location: Left Arm, Patient Position: Sitting, Cuff Size: Large)   Pulse 78   Temp 97.6 F (36.4 C) (Temporal)   Ht 5' 5.5" (1.664 m)   Wt 182 lb (82.6 kg)   SpO2 97%   BMI 29.83 kg/m    General: No acute distress. Awake and conversant.  Eyes: Normal conjunctiva, anicteric. Round symmetric pupils.  ENT: Hearing grossly intact. No nasal discharge.  Neck: Neck is supple. No masses or thyromegaly.  Respiratory: Respirations are non-labored. No auditory wheezing.  CTA B Skin: Warm. No rashes or ulcers.  Psych: Alert and oriented. Cooperative, Appropriate mood and affect, Normal judgment.  CV: No cyanosis or JVD, RRR, no MRG MSK: Normal ambulation. No clubbing  Neuro: Sensation and CN II-XII grossly normal.        [name], MD, MS

## 2021-11-16 ENCOUNTER — Ambulatory Visit: Payer: Medicare HMO | Admitting: Family Medicine

## 2021-11-16 ENCOUNTER — Telehealth: Payer: Self-pay | Admitting: Family Medicine

## 2021-11-16 NOTE — Telephone Encounter (Signed)
Pt was a no show for an OV with Dr. Grandville Silos on 11/16/21, I sent a letter.

## 2021-11-23 NOTE — Telephone Encounter (Signed)
1st no show, fee waived, letter sent 

## 2021-11-30 ENCOUNTER — Telehealth: Payer: Self-pay | Admitting: Family Medicine

## 2021-11-30 NOTE — Telephone Encounter (Signed)
Pt says he gave Dr Grandville Silos his medication list. He is wanting a script for ED, he says it's in his list.  [id number] - [place], [place] - [address]  [place], [place] [address]  Phone:  [phone]  Fax:  [phone]  DEA #:  [id number]

## 2021-12-01 NOTE — Telephone Encounter (Signed)
Tried calling patient, but no voicemail option available.  

## 2021-12-02 NOTE — Telephone Encounter (Signed)
Tried calling patient, but no voicemail option. Will try calling later.

## 2021-12-07 NOTE — Telephone Encounter (Signed)
Tried calling patient, but no voicemail option available.  

## 2021-12-17 NOTE — Telephone Encounter (Signed)
Pt daughter called stating we never sent in viagra generic. Advised we have not prescribed and pt will need appt.   Appt scheduled 11/20

## 2021-12-20 ENCOUNTER — Ambulatory Visit: Payer: Medicare HMO | Admitting: Family Medicine

## 2021-12-20 ENCOUNTER — Telehealth: Payer: Self-pay | Admitting: Family Medicine

## 2021-12-20 NOTE — Telephone Encounter (Signed)
11.20.23 no show letter sent 

## 2021-12-28 ENCOUNTER — Ambulatory Visit (INDEPENDENT_AMBULATORY_CARE_PROVIDER_SITE_OTHER): Payer: Medicare HMO | Admitting: Family Medicine

## 2021-12-28 ENCOUNTER — Encounter: Payer: Self-pay | Admitting: Family Medicine

## 2021-12-28 VITALS — BP 138/80 | HR 74 | Temp 97.6°F | Wt 177.4 lb

## 2021-12-28 DIAGNOSIS — R413 Other amnesia: Secondary | ICD-10-CM | POA: Diagnosis not present

## 2021-12-28 DIAGNOSIS — G309 Alzheimer's disease, unspecified: Secondary | ICD-10-CM | POA: Diagnosis not present

## 2021-12-28 DIAGNOSIS — R4189 Other symptoms and signs involving cognitive functions and awareness: Secondary | ICD-10-CM | POA: Diagnosis not present

## 2021-12-28 DIAGNOSIS — N529 Male erectile dysfunction, unspecified: Secondary | ICD-10-CM | POA: Diagnosis not present

## 2021-12-28 DIAGNOSIS — I10 Essential (primary) hypertension: Secondary | ICD-10-CM

## 2021-12-28 MED ORDER — SILDENAFIL CITRATE 100 MG PO TABS: 50.0000 mg | ORAL_TABLET | Freq: Every day | ORAL | 11 refills | Status: DC | PRN

## 2021-12-28 NOTE — Progress Notes (Signed)
Assessment/Plan:   Problem List Items Addressed This Visit       Cardiovascular and Mediastinum   Essential hypertension    Asymptomatic, stable Continue medications enalapril 20 mg twice daily, amlodipine 5 mg       Relevant Medications   sildenafil (VIAGRA) 100 MG tablet     Other   Erectile dysfunction    Refill sildenafil      Relevant Medications   sildenafil (VIAGRA) 100 MG tablet   Cognitive impairment    Concerns of memory loss and mild impairment on mini cog Lab work as ordered and referral to neurology      Relevant Orders   MR Brain Wo Contrast   B12 and Folate Panel (Completed)   Hemoglobin A1c (Completed)   Comp Met (CMET) (Completed)   Urinalysis, Routine w reflex microscopic   TSH (Completed)   RPR (Completed)   HIV antibody (with reflex) (Completed)   ANA w/Reflex (Completed)   Ambulatory referral to Neurology   Other Visit Diagnoses     Memory changes    -  Primary   Alzheimer's disease, unspecified (CODE) ([place])              Subjective:  HPI:  SENCERE SYMONETTE is a 76 y.o. male who has Hyperlipidemia; Essential hypertension; Erectile dysfunction; and Cognitive impairment on their problem list..   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 Rx refill on ED medications) .   Hypertension, established problem, stable BP Readings from Last 3 Encounters:  12/28/21 138/80  10/19/21 138/82    Current Medications: Amlodipine and enalapril, compliant without side effects. Interim History:  ROS: Denies any chest pain, shortness of breath, dyspnea on exertion, leg edema.   Erectile dysfunction.  Patient with history of erectile dysfunction.  Symptoms are controlled on sildenafil.  Patient requesting refill.  Dementia: He is here for evaluation and treatment of cognitive problems. He is accompanied  by spouse. Primary caregiver is patient and wife. The family and the patient identify problems with changes in short and long term memory.  Family and patient are concerned about  wandering and driving, however, they are not concerned about inability to maintain adequate nutrition. Medication administration: patient self medicates   Functional Assessment:  Activities of Daily Living (ADLs):   He is independent in the following: ambulation, bathing and hygiene, feeding, continence, grooming, toileting, and dressing Requires assistance with the following:  None Instrumental Activities of Daily Living (IADLs):   He is independent in the following: Driving although typically just just short distances due to wife's concern    Mini-Cog - 12/28/21 1406     Normal clock drawing test? yes    How many words correct? 0              No past surgical history on file.  Outpatient Medications Prior to Visit  Medication Sig Dispense Refill   amLODipine (NORVASC) 5 MG tablet Take 1 tablet (5 mg total) by mouth daily. 90 tablet 3   enalapril (VASOTEC) 20 MG tablet Take 1 tablet (20 mg total) by mouth 2 (two) times daily. 180 tablet 3   simvastatin (ZOCOR) 20 MG tablet Take 1 tablet (20 mg total) by mouth at bedtime. 90 tablet 3   No facility-administered medications prior to visit.    No family history on file.  Social History   Socioeconomic  History   Marital status: Married    Spouse name: Not on file   Number of children: Not on file   Years of education: Not on file   Highest education level: Not on file  Occupational History   Not on file  Tobacco Use   Smoking status: Never    Passive exposure: Never   Smokeless tobacco: Never  Vaping Use   Vaping Use: Never used  Substance and Sexual Activity   Alcohol use: Yes    Comment: Occassionally   Drug use: Never   Sexual activity: Not on file  Other Topics Concern   Not on file  Social History Narrative   Not on file   Social  Determinants of Health   Financial Resource Strain: Not on file  Food Insecurity: Not on file  Transportation Needs: Not on file  Physical Activity: Not on file  Stress: Not on file  Social Connections: Not on file  Intimate Partner Violence: Not on file                                                                                                 Objective:  Physical Exam: BP 138/80 (BP Location: Right Arm, Patient Position: Sitting, Cuff Size: Large)   Pulse 74   Temp 97.6 F (36.4 C) (Temporal)   Wt 177 lb 6.4 oz (80.5 kg)   SpO2 98%   BMI 29.07 kg/m    General: No acute distress. Awake and conversant.  Eyes: Normal conjunctiva, anicteric. Round symmetric pupils.  ENT: Hearing grossly intact. No nasal discharge.  Neck: Neck is supple. No masses or thyromegaly.  Respiratory: Respirations are non-labored. No auditory wheezing.  Skin: Warm. No rashes or ulcers.  Psych: Alert and oriented. Cooperative, Appropriate mood and affect, Normal judgment.  CV: No cyanosis or JVD, normal S1-S2, RRR, no MRGlan MSK: Normal ambulation. No clubbing  Neuro: Sensation and CN II-XII grossly normal.        Alesia Banda, MD, MS

## 2021-12-28 NOTE — Patient Instructions (Signed)
We are ordering blood test, an MRI of the brain and referring to neurology for memory changes.

## 2021-12-28 NOTE — Telephone Encounter (Signed)
2nd no show, fee generated, final warning letter sent via mail 

## 2021-12-29 LAB — COMPREHENSIVE METABOLIC PANEL
ALT: 13 U/L (ref 0–53)
AST: 17 U/L (ref 0–37)
Albumin: 4.4 g/dL (ref 3.5–5.2)
Alkaline Phosphatase: 52 U/L (ref 39–117)
BUN: 10 mg/dL (ref 6–23)
CO2: 24 mEq/L (ref 19–32)
Calcium: 9.3 mg/dL (ref 8.4–10.5)
Chloride: 104 mEq/L (ref 96–112)
Creatinine, Ser: 1.05 mg/dL (ref 0.40–1.50)
GFR: 68.85 mL/min (ref >60.00)
Glucose, Bld: 91 mg/dL (ref 70–99)
Potassium: 4 mEq/L (ref 3.5–5.1)
Sodium: 138 mEq/L (ref 135–145)
Total Bilirubin: 0.6 mg/dL (ref 0.2–1.2)
Total Protein: 6.9 g/dL (ref 6.0–8.3)

## 2021-12-29 LAB — ANA W/REFLEX: Anti Nuclear Antibody (ANA): NEGATIVE

## 2021-12-29 LAB — B12 AND FOLATE PANEL
Folate: 15.6 ng/mL (ref >5.9)
Vitamin B-12: 220 pg/mL (ref 211–911)

## 2021-12-29 LAB — HIV ANTIBODY (ROUTINE TESTING W REFLEX): HIV 1&2 Ab, 4th Generation: NONREACTIVE

## 2021-12-29 LAB — TSH: TSH: 1.3 u[IU]/mL (ref 0.35–5.50)

## 2021-12-29 LAB — RPR: RPR Ser Ql: NONREACTIVE

## 2021-12-29 LAB — HEMOGLOBIN A1C: Hgb A1c MFr Bld: 6 % (ref 4.6–6.5)

## 2021-12-30 ENCOUNTER — Telehealth: Payer: Self-pay

## 2021-12-30 DIAGNOSIS — R4189 Other symptoms and signs involving cognitive functions and awareness: Secondary | ICD-10-CM

## 2021-12-30 DIAGNOSIS — G309 Alzheimer's disease, unspecified: Secondary | ICD-10-CM

## 2021-12-30 DIAGNOSIS — R413 Other amnesia: Secondary | ICD-10-CM

## 2021-12-30 NOTE — Addendum Note (Signed)
Addended by: Fanny Bien B on: 12/30/2021 04:34 PM   Modules accepted: Orders

## 2021-12-30 NOTE — Telephone Encounter (Signed)
Per Sherri Cannady "Hey Can this same order be changed for the MRI. Can it be moved from [facility] center to [facility]?"

## 2022-01-02 DIAGNOSIS — R413 Other amnesia: Secondary | ICD-10-CM | POA: Insufficient documentation

## 2022-01-02 DIAGNOSIS — R4189 Other symptoms and signs involving cognitive functions and awareness: Secondary | ICD-10-CM | POA: Insufficient documentation

## 2022-01-02 NOTE — Assessment & Plan Note (Signed)
Concerns of memory loss and mild impairment on mini cog Lab work as ordered and referral to neurology

## 2022-01-02 NOTE — Assessment & Plan Note (Signed)
Refill sildenafil. 

## 2022-01-02 NOTE — Assessment & Plan Note (Addendum)
Asymptomatic, stable Continue medications enalapril 20 mg twice daily, amlodipine 5 mg

## 2022-01-05 ENCOUNTER — Ambulatory Visit (HOSPITAL_COMMUNITY)
Admission: RE | Admit: 2022-01-05 | Discharge: 2022-01-05 | Disposition: A | Discharge status: Home / Self Care | Payer: Medicare HMO | Source: Ambulatory Visit | Attending: Family Medicine | Admitting: Family Medicine

## 2022-01-05 DIAGNOSIS — R4189 Other symptoms and signs involving cognitive functions and awareness: Secondary | ICD-10-CM | POA: Diagnosis present

## 2022-02-08 ENCOUNTER — Telehealth: Payer: Self-pay | Admitting: Family Medicine

## 2022-02-08 NOTE — Telephone Encounter (Signed)
Left message for patient to call back and schedule Medicare Annual Wellness Visit (AWV) in office.   If not able to come in office, please offer to do virtually or by telephone.  Left office number and my jabber [phone].  AWVI eligible as of 04/01/2011  Please schedule at anytime with Nurse Health Advisor.

## 2022-03-02 ENCOUNTER — Ambulatory Visit: Payer: Self-pay | Admitting: Diagnostic Neuroimaging

## 2022-03-31 ENCOUNTER — Ambulatory Visit: Payer: Medicare HMO | Admitting: Family Medicine

## 2022-05-03 ENCOUNTER — Telehealth: Payer: Self-pay | Admitting: Family Medicine

## 2022-05-03 NOTE — Telephone Encounter (Signed)
Called patient to schedule Medicare Annual Wellness Visit (AWV). Left message for patient to call back and schedule Medicare Annual Wellness Visit (AWV).  Last date of AWV:  DUE 04/01/2011 AWVI PER [place]TO  Please schedule an appointment at any time with [facility].  If any questions, please contact me at [phone].  Thank you ,  Shirlean Mylar [phone]

## 2022-08-05 ENCOUNTER — Ambulatory Visit (INDEPENDENT_AMBULATORY_CARE_PROVIDER_SITE_OTHER): Payer: Medicare HMO

## 2022-08-05 VITALS — BP 122/70 | HR 71 | Temp 98.1°F | Ht 66.5 in | Wt 174.8 lb

## 2022-08-05 DIAGNOSIS — Z0000 Encounter for general adult medical examination without abnormal findings: Secondary | ICD-10-CM

## 2022-08-05 NOTE — Progress Notes (Signed)
Subjective:   Thomas Haynes is a 77 y.o. male who presents for an Initial Medicare Annual Wellness Visit.  Visit Complete: In person    Review of Systems     Cardiac Risk Factors include: advanced age (>92men, >23 women);dyslipidemia;hypertension;male gender     Objective:    Today's Vitals   08/05/22 1556  BP: 122/70  Pulse: 71  Temp: 98.1 F (36.7 C)  TempSrc: Oral  SpO2: 96%  Weight: 174 lb 12.8 oz (79.3 kg)  Height: 5' 6.5" (1.689 m)   Body mass index is 27.79 kg/m.     08/05/2022    4:03 PM  Advanced Directives  Does Patient Have a Medical Advance Directive? No  Would patient like information on creating a medical advance directive? No - Patient declined    Current Medications (verified) Outpatient Encounter Medications as of 08/05/2022  Medication Sig   amLODipine (NORVASC) 5 MG tablet Take 1 tablet (5 mg total) by mouth daily.   enalapril (VASOTEC) 20 MG tablet Take 1 tablet (20 mg total) by mouth 2 (two) times daily.   Multiple Vitamins-Minerals (CENTRUM MEN) TABS Take 1 tablet by mouth daily.   sildenafil (VIAGRA) 100 MG tablet Take 0.5-1 tablets (50-100 mg total) by mouth daily as needed for erectile dysfunction.   simvastatin (ZOCOR) 20 MG tablet Take 1 tablet (20 mg total) by mouth at bedtime.   No facility-administered encounter medications on file as of 08/05/2022.    Allergies (verified) Patient has no known allergies.   History: Past Medical History:  Diagnosis Date   Hyperlipidemia    Hypertension    Lichenoid dermatitis 09/05/2002   History reviewed. No pertinent surgical history. History reviewed. No pertinent family history. Social History   Socioeconomic History   Marital status: Married    Spouse name: Not on file   Number of children: Not on file   Years of education: Not on file   Highest education level: Not on file  Occupational History   Not on file  Tobacco Use   Smoking status: Never    Passive exposure: Never    Smokeless tobacco: Never  Vaping Use   Vaping Use: Never used  Substance and Sexual Activity   Alcohol use: Yes    Comment: Occassionally   Drug use: Never   Sexual activity: Yes  Other Topics Concern   Not on file  Social History Narrative   Not on file   Social Determinants of Health   Financial Resource Strain: Low Risk  (08/05/2022)   Overall Financial Resource Strain (CARDIA)    Difficulty of Paying Living Expenses: Not hard at all  Food Insecurity: No Food Insecurity (08/05/2022)   Hunger Vital Sign    Worried About Running Out of Food in the Last Year: Never true    Ran Out of Food in the Last Year: Never true  Transportation Needs: No Transportation Needs (08/05/2022)   PRAPARE - Administrator, Civil Service (Medical): No    Lack of Transportation (Non-Medical): No  Physical Activity: Insufficiently Active (08/05/2022)   Exercise Vital Sign    Days of Exercise per Week: 3 days    Minutes of Exercise per Session: 30 min  Stress: No Stress Concern Present (08/05/2022)   [employer] of Occupational Health - Occupational Stress Questionnaire    Feeling of Stress : Not at all  Social Connections: Socially Isolated (08/05/2022)   Social Connection and Isolation Panel [NHANES]    Frequency of Communication with  Friends and Family: Never    Frequency of Social Gatherings with Friends and Family: Once a week    Attends Religious Services: Never    Database administrator or Organizations: No    Attends Engineer, structural: Never    Marital Status: Married    Tobacco Counseling Counseling given: Not Answered   Clinical Intake:  Pre-visit preparation completed: Yes  Pain : No/denies pain     Nutritional Status: BMI 25 -29 Overweight Nutritional Risks: None Diabetes: No  How often do you need to have someone help you when you read instructions, pamphlets, or other written materials from your doctor or pharmacy?: 1 - Never  Interpreter Needed?:  No  Information entered by :: NAllen LPN   Activities of Daily Living    08/05/2022    3:57 PM  In your present state of health, do you have any difficulty performing the following activities:  Hearing? 0  Vision? 0  Difficulty concentrating or making decisions? 0  Walking or climbing stairs? 0  Dressing or bathing? 0  Doing errands, shopping? 0  Preparing Food and eating ? N  Using the Toilet? N  In the past six months, have you accidently leaked urine? N  Do you have problems with loss of bowel control? N  Managing your Medications? N  Managing your Finances? N  Housekeeping or managing your Housekeeping? N    Patient Care Team: Garnette Gunner, MD as PCP - General (Family Medicine)  Indicate any recent Medical Services you may have received from other than [facility] providers in the past year (date may be approximate).     Assessment:   This is a routine wellness examination for Thomas Haynes.  Hearing/Vision screen Hearing Screening - Comments:: Denies hearing issues Vision Screening - Comments:: Regular eye exams, in [place]  Dietary issues and exercise activities discussed:     Goals Addressed             This Visit's Progress    Patient Stated       08/05/2022, denies any goal at this time       Depression Screen    08/05/2022    4:05 PM 12/28/2021    2:35 PM 10/19/2021   10:30 AM  PHQ 2/9 Scores  PHQ - 2 Score 0 0 0  PHQ- 9 Score 0 0 0    Fall Risk    08/05/2022    4:04 PM 10/19/2021    9:59 AM  Fall Risk   Falls in the past year? 0 0  Number falls in past yr: 0 0  Injury with Fall? 0 0  Risk for fall due to : Medication side effect   Follow up Falls prevention discussed;Falls evaluation completed     MEDICARE RISK AT HOME:  Medicare Risk at Home - 08/05/22 1604     Any stairs in or around the home? No    If so, are there any without handrails? No    Home free of loose throw rugs in walkways, pet beds, electrical cords, etc? Yes    Adequate  lighting in your home to reduce risk of falls? Yes    Life alert? No    Use of a cane, walker or w/c? No    Grab bars in the bathroom? No    Shower chair or bench in shower? Yes    Elevated toilet seat or a handicapped toilet? No  TIMED UP AND GO:  Was the test performed? Yes  Length of time to ambulate 10 feet: 5 sec Gait steady and fast without use of assistive device    Cognitive Function:        08/05/2022    4:06 PM  6CIT Screen  What Year? 0 points  What month? 3 points  What time? 3 points  Count back from 20 0 points  Months in reverse 0 points  Repeat phrase 10 points  Total Score 16 points    Immunizations Immunization History  Administered Date(s) Administered   Fluad Quad(high Dose 65+) 10/19/2021   PFIZER(Purple Top)SARS-COV-2 Vaccination 04/15/2019, 05/07/2019, 01/04/2020   Tdap 05/31/2016    TDAP status: Up to date  Flu Vaccine status: Up to date  Pneumococcal vaccine status: Declined,  Education has been provided regarding the importance of this vaccine but patient still declined. Advised may receive this vaccine at local pharmacy or Health Dept. Aware to provide a copy of the vaccination record if obtained from local pharmacy or Health Dept. Verbalized acceptance and understanding.   Covid-19 vaccine status: Information provided on how to obtain vaccines.   Qualifies for Shingles Vaccine? Yes   Zostavax completed No   Shingrix Completed?: No.    Education has been provided regarding the importance of this vaccine. Patient has been advised to call insurance company to determine out of pocket expense if they have not yet received this vaccine. Advised may also receive vaccine at local pharmacy or Health Dept. Verbalized acceptance and understanding.  Screening Tests Health Maintenance  Topic Date Due   Hepatitis C Screening  Never done   Zoster Vaccines- Shingrix (1 of 2) Never done   COVID-19 Vaccine (4 - 2023-24 season) 10/01/2021    Pneumonia Vaccine 20+ Years old (1 of 1 - PCV) 10/20/2022 (Originally 04/15/2010)   INFLUENZA VACCINE  09/01/2022   Medicare Annual Wellness (AWV)  08/05/2023   DTaP/Tdap/Td (2 - Td or Tdap) 06/01/2026   HPV VACCINES  Aged Out    Health Maintenance  Health Maintenance Due  Topic Date Due   Hepatitis C Screening  Never done   Zoster Vaccines- Shingrix (1 of 2) Never done   COVID-19 Vaccine (4 - 2023-24 season) 10/01/2021    Colorectal cancer screening: No longer required.   Lung Cancer Screening: (Low Dose CT Chest recommended if Age 80-80 years, 20 pack-year currently smoking OR have quit w/in 15years.) does not qualify.   Lung Cancer Screening Referral: no  Additional Screening:  Hepatitis C Screening: does qualify;   Vision Screening: Recommended annual ophthalmology exams for early detection of glaucoma and other disorders of the eye. Is the patient up to date with their annual eye exam?  Yes  Who is the provider or what is the name of the office in which the patient attends annual eye exams? In [place] If pt is not established with a provider, would they like to be referred to a provider to establish care? No .   Dental Screening: Recommended annual dental exams for proper oral hygiene  Diabetic Foot Exam: n/a  Community Resource Referral / Chronic Care Management: CRR required this visit?  No   CCM required this visit?  No    Plan:     I have personally reviewed and noted the following in the patient's chart:   Medical and social history Use of alcohol, tobacco or illicit drugs  Current medications and supplements including opioid prescriptions. Patient is not currently taking opioid prescriptions.  Functional ability and status Nutritional status Physical activity Advanced directives List of other physicians Hospitalizations, surgeries, and ER visits in previous 12 months Vitals Screenings to include cognitive, depression, and falls Referrals and  appointments  In addition, I have reviewed and discussed with patient certain preventive protocols, quality metrics, and best practice recommendations. A written personalized care plan for preventive services as well as general preventive health recommendations were provided to patient.     Barb Merino, LPN   [id number]   After Visit Summary: in person  Nurse Notes: none

## 2022-08-05 NOTE — Patient Instructions (Signed)
Thomas Haynes , Thank you for taking time to come for your Medicare Wellness Visit. I appreciate your ongoing commitment to your health goals. Please review the following plan we discussed and let me know if I can assist you in the future.   These are the goals we discussed:  Goals      Patient Stated     08/05/2022, denies any goal at this time        This is a list of the screening recommended for you and due dates:  Health Maintenance  Topic Date Due   Hepatitis C Screening  Never done   Zoster (Shingles) Vaccine (1 of 2) Never done   COVID-19 Vaccine (4 - 2023-24 season) 10/01/2021   Pneumonia Vaccine (1 of 1 - PCV) 10/20/2022*   Flu Shot  09/01/2022   Medicare Annual Wellness Visit  08/05/2023   DTaP/Tdap/Td vaccine (2 - Td or Tdap) 06/01/2026   HPV Vaccine  Aged Out  *Topic was postponed. The date shown is not the original due date.    Advanced directives: Advance directive discussed with you today. Even though you declined this today please call our office should you change your mind and we can give you the proper paperwork for you to fill out.  Conditions/risks identified: none  Next appointment: Follow up in one year for your annual wellness visit.   Preventive Care 27 Years and Older, Male  Preventive care refers to lifestyle choices and visits with your health care provider that can promote health and wellness. What does preventive care include? A yearly physical exam. This is also called an annual well check. Dental exams once or twice a year. Routine eye exams. Ask your health care provider how often you should have your eyes checked. Personal lifestyle choices, including: Daily care of your teeth and gums. Regular physical activity. Eating a healthy diet. Avoiding tobacco and drug use. Limiting alcohol use. Practicing safe sex. Taking low doses of aspirin every day. Taking vitamin and mineral supplements as recommended by your health care provider. What happens  during an annual well check? The services and screenings done by your health care provider during your annual well check will depend on your age, overall health, lifestyle risk factors, and family history of disease. Counseling  Your health care provider may ask you questions about your: Alcohol use. Tobacco use. Drug use. Emotional well-being. Home and relationship well-being. Sexual activity. Eating habits. History of falls. Memory and ability to understand (cognition). Work and work Astronomer. Screening  You may have the following tests or measurements: Height, weight, and BMI. Blood pressure. Lipid and cholesterol levels. These may be checked every 5 years, or more frequently if you are over 68 years old. Skin check. Lung cancer screening. You may have this screening every year starting at age 80 if you have a 30-pack-year history of smoking and currently smoke or have quit within the past 15 years. Fecal occult blood test (FOBT) of the stool. You may have this test every year starting at age 47. Flexible sigmoidoscopy or colonoscopy. You may have a sigmoidoscopy every 5 years or a colonoscopy every 10 years starting at age 57. Prostate cancer screening. Recommendations will vary depending on your family history and other risks. Hepatitis C blood test. Hepatitis B blood test. Sexually transmitted disease (STD) testing. Diabetes screening. This is done by checking your blood sugar (glucose) after you have not eaten for a while (fasting). You may have this done every 1-3 years. Abdominal  aortic aneurysm (AAA) screening. You may need this if you are a current or former smoker. Osteoporosis. You may be screened starting at age 48 if you are at high risk. Talk with your health care provider about your test results, treatment options, and if necessary, the need for more tests. Vaccines  Your health care provider may recommend certain vaccines, such as: Influenza vaccine. This is  recommended every year. Tetanus, diphtheria, and acellular pertussis (Tdap, Td) vaccine. You may need a Td booster every 10 years. Zoster vaccine. You may need this after age 59. Pneumococcal 13-valent conjugate (PCV13) vaccine. One dose is recommended after age 53. Pneumococcal polysaccharide (PPSV23) vaccine. One dose is recommended after age 35. Talk to your health care provider about which screenings and vaccines you need and how often you need them. This information is not intended to replace advice given to you by your health care provider. Make sure you discuss any questions you have with your health care provider. Document Released: 02/13/2015 Document Revised: 10/07/2015 Document Reviewed: 11/18/2014 Elsevier Interactive Patient Education  2017 [employer].  Fall Prevention in the Home Falls can cause injuries. They can happen to people of all ages. There are many things you can do to make your home safe and to help prevent falls. What can I do on the outside of my home? Regularly fix the edges of walkways and driveways and fix any cracks. Remove anything that might make you trip as you walk through a door, such as a raised step or threshold. Trim any bushes or trees on the path to your home. Use bright outdoor lighting. Clear any walking paths of anything that might make someone trip, such as rocks or tools. Regularly check to see if handrails are loose or broken. Make sure that both sides of any steps have handrails. Any raised decks and porches should have guardrails on the edges. Have any leaves, snow, or ice cleared regularly. Use sand or salt on walking paths during winter. Clean up any spills in your garage right away. This includes oil or grease spills. What can I do in the bathroom? Use night lights. Install grab bars by the toilet and in the tub and shower. Do not use towel bars as grab bars. Use non-skid mats or decals in the tub or shower. If you need to sit down in  the shower, use a plastic, non-slip stool. Keep the floor dry. Clean up any water that spills on the floor as soon as it happens. Remove soap buildup in the tub or shower regularly. Attach bath mats securely with double-sided non-slip rug tape. Do not have throw rugs and other things on the floor that can make you trip. What can I do in the bedroom? Use night lights. Make sure that you have a light by your bed that is easy to reach. Do not use any sheets or blankets that are too big for your bed. They should not hang down onto the floor. Have a firm chair that has side arms. You can use this for support while you get dressed. Do not have throw rugs and other things on the floor that can make you trip. What can I do in the kitchen? Clean up any spills right away. Avoid walking on wet floors. Keep items that you use a lot in easy-to-reach places. If you need to reach something above you, use a strong step stool that has a grab bar. Keep electrical cords out of the way. Do not use  floor polish or wax that makes floors slippery. If you must use wax, use non-skid floor wax. Do not have throw rugs and other things on the floor that can make you trip. What can I do with my stairs? Do not leave any items on the stairs. Make sure that there are handrails on both sides of the stairs and use them. Fix handrails that are broken or loose. Make sure that handrails are as long as the stairways. Check any carpeting to make sure that it is firmly attached to the stairs. Fix any carpet that is loose or worn. Avoid having throw rugs at the top or bottom of the stairs. If you do have throw rugs, attach them to the floor with carpet tape. Make sure that you have a light switch at the top of the stairs and the bottom of the stairs. If you do not have them, ask someone to add them for you. What else can I do to help prevent falls? Wear shoes that: Do not have high heels. Have rubber bottoms. Are comfortable  and fit you well. Are closed at the toe. Do not wear sandals. If you use a stepladder: Make sure that it is fully opened. Do not climb a closed stepladder. Make sure that both sides of the stepladder are locked into place. Ask someone to hold it for you, if possible. Clearly mark and make sure that you can see: Any grab bars or handrails. First and last steps. Where the edge of each step is. Use tools that help you move around (mobility aids) if they are needed. These include: Canes. Walkers. Scooters. Crutches. Turn on the lights when you go into a dark area. Replace any light bulbs as soon as they burn out. Set up your furniture so you have a clear path. Avoid moving your furniture around. If any of your floors are uneven, fix them. If there are any pets around you, be aware of where they are. Review your medicines with your doctor. Some medicines can make you feel dizzy. This can increase your chance of falling. Ask your doctor what other things that you can do to help prevent falls. This information is not intended to replace advice given to you by your health care provider. Make sure you discuss any questions you have with your health care provider. Document Released: 11/13/2008 Document Revised: 06/25/2015 Document Reviewed: 02/21/2014 Elsevier Interactive Patient Education  2017 [employer].

## 2022-10-21 ENCOUNTER — Other Ambulatory Visit: Payer: Self-pay | Admitting: Family Medicine

## 2022-10-21 DIAGNOSIS — E785 Hyperlipidemia, unspecified: Secondary | ICD-10-CM

## 2023-03-22 ENCOUNTER — Encounter: Payer: Self-pay | Admitting: Family Medicine

## 2023-03-22 ENCOUNTER — Telehealth (INDEPENDENT_AMBULATORY_CARE_PROVIDER_SITE_OTHER): Payer: Medicare HMO | Admitting: Family Medicine

## 2023-03-22 VITALS — Ht 66.5 in | Wt 165.0 lb

## 2023-03-22 DIAGNOSIS — E785 Hyperlipidemia, unspecified: Secondary | ICD-10-CM

## 2023-03-22 DIAGNOSIS — E782 Mixed hyperlipidemia: Secondary | ICD-10-CM

## 2023-03-22 DIAGNOSIS — G309 Alzheimer's disease, unspecified: Secondary | ICD-10-CM

## 2023-03-22 DIAGNOSIS — I10 Essential (primary) hypertension: Secondary | ICD-10-CM

## 2023-03-22 DIAGNOSIS — N529 Male erectile dysfunction, unspecified: Secondary | ICD-10-CM

## 2023-03-22 MED ORDER — SILDENAFIL CITRATE 100 MG PO TABS: 50.0000 mg | ORAL_TABLET | Freq: Every day | ORAL | 0 refills | Status: DC | PRN

## 2023-03-22 MED ORDER — SIMVASTATIN 20 MG PO TABS: 20.0000 mg | ORAL_TABLET | Freq: Every day | ORAL | 0 refills | Status: DC

## 2023-03-22 MED ORDER — ENALAPRIL MALEATE 20 MG PO TABS: 20.0000 mg | ORAL_TABLET | Freq: Two times a day (BID) | ORAL | 0 refills | Status: DC

## 2023-03-22 MED ORDER — AMLODIPINE BESYLATE 5 MG PO TABS: 5.0000 mg | ORAL_TABLET | Freq: Every day | ORAL | 0 refills | Status: DC

## 2023-03-22 NOTE — Progress Notes (Signed)
 Virtual Visit via Video Note  I connected with Thomas Haynes on 03/22/2023 at  1:40 PM EST by a video enabled telemedicine application and verified that I am speaking with the correct person using two identifiers.  Location: Patient: home Provider: office   I discussed the limitations of evaluation and management by telemedicine and the availability of in person appointments. The patient expressed understanding and agreed to proceed.  History of Present Illness:  Chief Complaint  Patient presents with   Follow-up    F/u meds and having some memory issues.         History of Present Illness   Thomas Haynes is a 78 year old male with cognitive decline who presents with worsening memory and agitation. He is accompanied by his daughter, who is his primary caregiver.  He has been experiencing worsening memory and agitation, as noted by his daughter, who is concerned about his cognitive decline and is seeking medication to help him 'mellow out.'  He was last evaluated in November 2023 for similar complaints, where mild cognitive impairment was identified on a mini COG exam. At that time, lab work and an MRI were conducted. The MRI from December 2023 showed generalized brain atrophy with temporal lobe predominance and mild chronic small vessel ischemic changes. His prior lab work included normal B12, folate, TSH, RPR, HIV, and antinuclear antibody tests. Hemoglobin A1c was mildly elevated at 6.0, indicating prediabetes. He was referred to neurology but did not follow up due to refusal.  His medical history includes hypertension, managed with enalapril 20 mg twice daily and amlodipine 5 mg daily, and hyperlipidemia, controlled with simvastatin 20 mg daily. His last recorded blood pressure was 138/80. He also experiences erectile dysfunction, for which he takes sildenafil 50 to 100 mg as needed.  His daughter is now his primary caregiver as he has moved in with her, due to his wife's inability to  continue caregiving because of her own age and chronic disease. He remains independent in ambulation, hygiene, feeding, continence, grooming, toileting, and dressing, and drives short distances, although this has caused concern for his wife.      Observations/Objective: There were no vitals filed for this visit.  Results   LABS B12: 220 (12/28/2021) Folate: 15.5 (12/28/2021) Hemoglobin A1c: 6.0 (12/28/2021) TSH: 1.3 (12/28/2021) RPR: nonreactive (12/28/2021) HIV: nonreactive (12/28/2021) Antinuclear antibody: negative (12/28/2021)  RADIOLOGY MRI brain: Generalized brain atrophy with temporal lobe predominance, mild chronic small vessel ischemic change of cerebral hemispheric white matter (01/05/2022)      Gen: NAD, resting comfortably HEENT: EOMI Pulm: NWOB Skin: no rash on face Neuro: no facial asymmetry or dysmetria Psych: Normal affect  Prior labs:   No results found for this or any previous visit (from the past 2160 hours).  Lab Results  Component Value Date   CHOL 160 06/18/2021   CHOL 179 12/16/2020   CHOL 166 06/10/2020   Lab Results  Component Value Date   HDL 40 06/18/2021   HDL 44 12/16/2020   HDL 43 06/10/2020   Lab Results  Component Value Date   LDLCALC 96 06/18/2021   LDLCALC 109 12/16/2020   LDLCALC 98 06/10/2020   Lab Results  Component Value Date   TRIG 132 06/18/2021   TRIG 148 12/16/2020   TRIG 140 06/10/2020   No results found for: "CHOLHDL" No results found for: "LDLDIRECT"  Last metabolic panel Lab Results  Component Value Date   GLUCOSE 91 12/28/2021   NA 138 12/28/2021  K 4.0 12/28/2021   CL 104 12/28/2021   CO2 24 12/28/2021   BUN 10 12/28/2021   CREATININE 1.05 12/28/2021   GFR 68.85 12/28/2021   CALCIUM 9.3 12/28/2021   PROT 6.9 12/28/2021   ALBUMIN 4.4 12/28/2021   BILITOT 0.6 12/28/2021   ALKPHOS 52 12/28/2021   AST 17 12/28/2021   ALT 13 12/28/2021    Lab Results  Component Value Date   HGBA1C 6.0  12/28/2021    Last CBC Lab Results  Component Value Date   WBC 6.8 06/18/2021   HGB 16.5 06/18/2021   HCT 47 06/18/2021   PLT 237 06/18/2021    Lab Results  Component Value Date   TSH 1.30 12/28/2021    Lab Results  Component Value Date   PSA 6.6 12/16/2020   PSA 8.1 12/05/2019   PSA 5.0 06/22/2019    Last vitamin D No results found for: "25OHVITD2", "25OHVITD3", "VD25OH"  No results found for: "COLORU", "CLARITYU", "GLUCOSEUR", "BILIRUBINUR", "KETONESU", "SPECGRAV", "RBCUR", "PHUR", "PROTEINUR", "UROBILINOGEN", "LEUKOCYTESUR"  No results found for: "LABMICR", "MICROALBUR"    Assessment and Plan:  Dementia Progressive cognitive decline with worsening memory and agitation. Previous mini COG exam indicated mild cognitive impairment. MRI showed generalized brain atrophy with temporal lobe predominance and mild chronic small vessel ischemic changes. Differential diagnosis includes Alzheimer's disease, frontotemporal dementia, and vascular dementia. Discussed the importance of follow-up with neurology for additional testing, imaging, neuropsychiatric evaluation, and potential medication management. Daughter is agreeable to this plan. - Refer to neurology for further evaluation and management - Place referral to social work for dementia resources - Discuss community resources for dementia with family  Hypertension Hypertension managed with enalapril 20 mg BID and amlodipine 5 mg daily. Last recorded blood pressure was 138/80 mmHg. Emphasized the need for regular monitoring to maintain tight vascular control to prevent further cognitive decline. - Recommend blood pressure check within one month - Refill enalapril 20 mg BID and amlodipine 5 mg daily for one month  Hyperlipidemia Hyperlipidemia controlled with simvastatin 20 mg daily. Regular monitoring is essential to maintain tight vascular control. - Recommend cholesterol check within one month - Refill simvastatin 20 mg  daily for one month  Prediabetes Hemoglobin A1c was 6.0% in the prediabetic range. Regular monitoring is essential to maintain tight vascular control. - Recommend fasting lab work within one month to screen for diabetes  Erectile Dysfunction Erectile dysfunction managed with sildenafil 50-100 mg as needed for sexual activity. - Refill sildenafil 50-100 mg as needed for one month  General Health Maintenance None - Recommend fasting lab work within one month to screen for renal disease, liver disease, and other metabolic components  Follow-up - Return to clinic for blood pressure check and fasting lab work within one month - Office to call about the referral to neurology and social work - Follow up to get further refills after initial one-month supply.        There are no discontinued medications.   Follow Up Instructions:     I discussed the assessment and treatment plan with the patient. The patient was provided an opportunity to ask questions and all were answered. The patient agreed with the plan and demonstrated an understanding of the instructions.   The patient was advised to call back or seek an in-person evaluation if the symptoms worsen or if the condition fails to improve as anticipated.  Garnette Gunner, MD

## 2023-03-23 ENCOUNTER — Telehealth: Payer: Self-pay | Admitting: *Deleted

## 2023-03-23 NOTE — Progress Notes (Signed)
 Complex Care Management Note  Care Guide Note 03/23/2023 Name: ERLE GUSTER MRN: [id number] DOB: Dec 21, 1945  AHMAN DUGDALE is a 78 y.o. year old male who sees Garnette Gunner, MD for primary care. I reached out to Benard Halsted daughter Patent attorney by phone today to offer complex care management services.  Mr. Martinek daughter Ellin Mayhew was given information about Complex Care Management services today including:   The Complex Care Management services include support from the care team which includes your Nurse Care Manager, Clinical Social Worker, or Pharmacist.  The Complex Care Management team is here to help remove barriers to the health concerns and goals most important to you. Complex Care Management services are voluntary, and the patient may decline or stop services at any time by request to their care team member.   Complex Care Management Consent Status: Patient daughter Ellin Mayhew agreed to services and verbal consent obtained.   Follow up plan:  Telephone appointment with complex care management team member scheduled for:  2/26  Encounter Outcome:  Patient Scheduled  Gwenevere Ghazi  [facility]  [facility], [facility] Guide  Direct Dial: [phone]  Fax [phone]

## 2023-03-29 ENCOUNTER — Ambulatory Visit: Payer: Self-pay | Admitting: Licensed Clinical Social Worker

## 2023-03-29 NOTE — Patient Outreach (Signed)
 Care Coordination   Initial Visit Note   03/29/2023 Name: Thomas Haynes MRN: [id number] DOB: [birth year]  Thomas Haynes is a 78 y.o. year old male who sees Garnette Gunner, MD for primary care. I spoke with  Benard Halsted by phone today.  What matters to the patients health and wellness today?  Care for patient related to dementia behaviors.    Goals Addressed             This Visit's Progress    Care Coordination       Care Coordination Interventions: Assessed Social Determinants of Health Reviewed all upcoming appointments in Epic system Active listening / Reflection utilized  Emotional Support Provided Problem Solving /Task Center strategies reviewed Reviewed independent living facilities Strategies for managing dementia symptoms in parents Review safety measure with dementia behaviors.          SDOH assessments and interventions completed:  Yes{THN Tip this will not be part of the note when signed-REQUIRED REPORT FIELD DO NOT DELETE (Optional):27901}     Care Coordination Interventions:  Yes, provided {THN Tip this will not be part of the note when signed-REQUIRED REPORT FIELD DO NOT DELETE (Optional):27901}  Interventions Today    Flowsheet Row Most Recent Value  Chronic Disease   Chronic disease during today's visit Other  [Dementia]  General Interventions   General Interventions Discussed/Reviewed [facility], General Interventions Discussed  [[employer]        Follow up plan: Follow up call scheduled for 04/12/2023    Encounter Outcome:  Patient Visit Completed {THN Tip this will not be part of the note when signed-REQUIRED REPORT FIELD DO NOT DELETE (Optional):27901}  Kenton Kingfisher, LCSW [place], [facility] Licensed Clinical Social Worker Care Coordinator [phone]

## 2023-04-03 ENCOUNTER — Encounter: Payer: Self-pay | Admitting: Physician Assistant

## 2023-04-12 ENCOUNTER — Ambulatory Visit: Payer: Self-pay | Admitting: Licensed Clinical Social Worker

## 2023-04-12 NOTE — Patient Instructions (Signed)
 Visit Information  Thank you for taking time to visit with me today. Please don't hesitate to contact me if I can be of assistance to you.   Following are the goals we discussed today:   Goals Addressed             This Visit's Progress    Care Coordination       Care Coordination Interventions: Assessed Social Determinants of Health Reviewed all upcoming appointments in Epic system Active listening / Reflection utilized  Emotional Support Provided Problem Solving /Task Center strategies reviewed Reviewed independent living facilities Strategies for managing dementia symptoms in parents Review safety measure with dementia behaviors. Contact doctors office for possible testing to be done Reviewed appropriate boundaries for dtr regarding pt care          Our next appointment is by telephone on 05/01/2023.  Please call the care guide team at [phone] if you need to cancel or reschedule your appointment.   If you are experiencing a Mental Health or Behavioral Health Crisis or need someone to talk to, please call the Suicide and Crisis Lifeline: 988  Patient verbalizes understanding of instructions and care plan provided today and agrees to view in MyChart. Active MyChart status and patient understanding of how to access instructions and care plan via MyChart confirmed with patient.     Telephone follow up appointment with care management team member scheduled for: 05/01/2023

## 2023-04-12 NOTE — Patient Outreach (Signed)
 Care Coordination   Follow Up Visit Note   04/12/2023 Name: Thomas Haynes MRN: [id number] DOB: Sep 03, 1945  Thomas Haynes is a 78 y.o. year old male who sees Garnette Gunner, MD for primary care. I  spoke with pt's dtr Victorino Dike by phone today.  What matters to the patients health and wellness today?  Better managing pt's care needs.    Goals Addressed             This Visit's Progress    Care Coordination       Care Coordination Interventions: Assessed Social Determinants of Health Reviewed all upcoming appointments in Epic system Active listening / Reflection utilized  Emotional Support Provided Problem Solving /Task Center strategies reviewed Reviewed independent living facilities Strategies for managing dementia symptoms in parents Review safety measure with dementia behaviors. Contact doctors office for possible testing to be done Reviewed appropriate boundaries for dtr regarding pt care          SDOH assessments and interventions completed:  Yes  SDOH Interventions Today    Flowsheet Row Most Recent Value  SDOH Interventions   Food Insecurity Interventions Intervention Not Indicated  Housing Interventions Intervention Not Indicated  Transportation Interventions Intervention Not Indicated  Utilities Interventions Intervention Not Indicated        Care Coordination Interventions:  Yes, provided   Interventions Today    Flowsheet Row Most Recent Value  Chronic Disease   Chronic disease during today's visit Other  [Dementia]  General Interventions   General Interventions Discussed/Reviewed [facility], General Interventions Discussed  [Dtr reported that the pt told dtr to leave the home since previous call. Dtr and family are only going for short visits and did call police for a wellness check. Police reported pt was doing fine. We reviewed options for guardianship and contacting APS.]  Education Interventions   Education Provided Provided Education   [CSW encouraged pt to contact doctors office to discuss ways to have labs completed for pt as dtr was worried about a UTI. Dtr agreed to contact pt's doctors office.]  Mental Health Interventions   Mental Health Discussed/Reviewed Mental Health Discussed, Mental Health Reviewed, Coping Strategies  [CSW and dtr discussed her caregiver stress and strategies for putting noundaries into place regarding what care she can provide.]        Follow up plan: Follow up call scheduled for 05/01/2023    Encounter Outcome:  Patient Visit Completed   Thomas Kingfisher, LCSW [place], [facility] Licensed Clinical Social Worker Care Coordinator [phone]

## 2023-05-01 ENCOUNTER — Ambulatory Visit: Payer: Self-pay | Admitting: Licensed Clinical Social Worker

## 2023-05-01 NOTE — Patient Outreach (Signed)
 Care Coordination   Follow Up Visit Note   05/01/2023 Name: Thomas Haynes MRN: [id number] DOB: 04-24-1945  Thomas Haynes is a 78 y.o. year old male who sees Garnette Gunner, MD for primary care. I spoke with  Benard Halsted by phone today.  What matters to the patients health and wellness today?      Goals Addressed             This Visit's Progress    COMPLETED: Care Coordination       Care Coordination Interventions: Assessed Social Determinants of Health Reviewed all upcoming appointments in Epic system Active listening / Reflection utilized  Emotional Support Provided Problem Solving /Task Center strategies reviewed Reviewed independent living facilities Strategies for managing dementia symptoms in parents Review safety measure with dementia behaviors. Contact doctors office for possible testing to be done Reviewed appropriate boundaries for dtr regarding pt care Attend Neurology appt with pt and asking questions regarding pt's mood and safety          SDOH assessments and interventions completed:  Yes     Care Coordination Interventions:  Yes, provided   Interventions Today    Flowsheet Row Most Recent Value  Chronic Disease   Chronic disease during today's visit Other  [Dementia]  General Interventions   General Interventions Discussed/Reviewed General Interventions Discussed, [employer] spoke with pt's dtr - at this time they are moving pt back into his home and are not moving. They have a neurology appt for pt and they plan on asking about medications to help with pt's mood. Dtr denied any needs at this time.]  Mental Health Interventions   Mental Health Discussed/Reviewed Mental Health Discussed, Mental Health Reviewed  [CSW and pt's dtr discussed caregiver stress and how she is managing at this time. Dtr reported she has started seeing a therapist and this has helped with her mood. She is also working on setting boundaries with the pt's care  and the way he talks to her.]        Follow up plan: No further intervention required.   Encounter Outcome:  Patient Visit Completed   Kenton Kingfisher, LCSW [facility], [facility] Licensed Clinical Social Worker Care Coordinator [phone]

## 2023-05-10 ENCOUNTER — Encounter: Payer: Self-pay | Admitting: Physician Assistant

## 2023-05-10 ENCOUNTER — Ambulatory Visit

## 2023-05-10 ENCOUNTER — Other Ambulatory Visit

## 2023-05-10 ENCOUNTER — Ambulatory Visit: Admitting: Physician Assistant

## 2023-05-10 VITALS — BP 130/78 | HR 54 | Resp 20 | Ht 66.0 in

## 2023-05-10 DIAGNOSIS — R413 Other amnesia: Secondary | ICD-10-CM

## 2023-05-10 DIAGNOSIS — D519 Vitamin B12 deficiency anemia, unspecified: Secondary | ICD-10-CM | POA: Diagnosis not present

## 2023-05-10 DIAGNOSIS — E519 Thiamine deficiency, unspecified: Secondary | ICD-10-CM | POA: Diagnosis not present

## 2023-05-10 MED ORDER — MEMANTINE HCL 10 MG PO TABS: ORAL_TABLET | ORAL | 11 refills | Status: DC
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
## 2023-05-10 NOTE — Patient Instructions (Addendum)
 It was a pleasure to see you today at our office.   Recommendations:  MRI of the brain, the radiology office will call you to arrange you appointment [phone]  Start Memantine 10 mg: Take 1 tablet (10 mg at night) for 2 weeks, then increase to 1 tablet (10 mg) twice a day.    Check labs today [address] Follow up in  months Recommend visiting the website : " Dementia Success Path" to better understand some behaviors related to memory loss.    For psychiatric meds, mood meds: Please have your primary care physician manage these medications.  If you have any severe symptoms of a stroke, or other severe issues such as confusion,severe chills or fever, etc call 911 or go to the ER as you may need to be evaluated further   For guidance regarding [facility] and if placement were needed at the facility, contact Social Worker tel: [phone]  For assessment of decision of mental capacity and competency:  Call Dr. Erick Blinks, geriatric psychiatrist at [phone]  Counseling regarding caregiver distress, including caregiver depression, anxiety and issues regarding community resources, adult day care programs, adult living facilities, or memory care questions:  please contact your  Primary Doctor's Social Worker   Whom to call: Memory  decline, memory medications: Call our office [phone]    [email or web address]   RECOMMENDATIONS FOR ALL PATIENTS WITH MEMORY PROBLEMS: 1. Continue to exercise (Recommend 30 minutes of walking everyday, or 3 hours every week) 2. Increase social interactions - continue going to [place] and enjoy social gatherings with friends and family 3. Eat healthy, avoid fried foods and eat more fruits and vegetables 4. Maintain adequate blood pressure, blood sugar, and blood cholesterol level. Reducing the risk of stroke and cardiovascular disease also helps promoting better memory. 5. Avoid  stressful situations. Live a simple life and avoid aggravations. Organize your time and prepare for the next day in anticipation. 6. Sleep well, avoid any interruptions of sleep and avoid any distractions in the bedroom that may interfere with adequate sleep quality 7. Avoid sugar, avoid sweets as there is a strong link between excessive sugar intake, diabetes, and cognitive impairment We discussed the Mediterranean diet, which has been shown to help patients reduce the risk of progressive memory disorders and reduces cardiovascular risk. This includes eating fish, eat fruits and green leafy vegetables, nuts like almonds and hazelnuts, walnuts, and also use olive oil. Avoid fast foods and fried foods as much as possible. Avoid sweets and sugar as sugar use has been linked to worsening of memory function.  There is always a concern of gradual progression of memory problems. If this is the case, then we may need to adjust level of care according to patient needs. Support, both to the patient and caregiver, should then be put into place.        DRIVING: Regarding driving, in patients with progressive memory problems, driving will be impaired. We advise to have someone else do the driving if trouble finding directions or if minor accidents are reported. Independent driving assessment is available to determine safety of driving.   If you are interested in the driving assessment, you can contact the following:  [facility] in [place] [phone]  [facility] [phone]  [facility] [phone]  [facility] [phone] or [phone]   FALL PRECAUTIONS: Be cautious when walking. Scan the area for obstacles that may increase the risk of trips and falls. When getting up in  the mornings, sit up at the edge of the bed for a few minutes before getting out of bed. Consider elevating the bed at the head end to avoid drop of blood pressure when  getting up. Walk always in a well-lit room (use night lights in the walls). Avoid area rugs or power cords from appliances in the middle of the walkways. Use a walker or a cane if necessary and consider physical therapy for balance exercise. Get your eyesight checked regularly.  FINANCIAL OVERSIGHT: Supervision, especially oversight when making financial decisions or transactions is also recommended.  HOME SAFETY: Consider the safety of the kitchen when operating appliances like stoves, microwave oven, and blender. Consider having supervision and share cooking responsibilities until no longer able to participate in those. Accidents with firearms and other hazards in the house should be identified and addressed as well.   ABILITY TO BE LEFT ALONE: If patient is unable to contact 911 operator, consider using LifeLine, or when the need is there, arrange for someone to stay with patients. Smoking is a fire hazard, consider supervision or cessation. Risk of wandering should be assessed by caregiver and if detected at any point, supervision and safe proof recommendations should be instituted.  MEDICATION SUPERVISION: Inability to self-administer medication needs to be constantly addressed. Implement a mechanism to ensure safe administration of the medications.      Mediterranean Diet A Mediterranean diet refers to food and lifestyle choices that are based on the traditions of countries located on the [employer]. This way of eating has been shown to help prevent certain conditions and improve outcomes for people who have chronic diseases, like kidney disease and heart disease. What are tips for following this plan? Lifestyle  Cook and eat meals together with your family, when possible. Drink enough fluid to keep your urine clear or pale yellow. Be physically active every day. This includes: Aerobic exercise like running or swimming. Leisure activities like gardening, walking, or  housework. Get 7-8 hours of sleep each night. If recommended by your health care provider, drink red wine in moderation. This means 1 glass a day for nonpregnant women and 2 glasses a day for men. A glass of wine equals 5 oz (150 mL). Reading food labels  Check the serving size of packaged foods. For foods such as rice and pasta, the serving size refers to the amount of cooked product, not dry. Check the total fat in packaged foods. Avoid foods that have saturated fat or trans fats. Check the ingredients list for added sugars, such as corn syrup. Shopping  At the grocery store, buy most of your food from the areas near the walls of the store. This includes: Fresh fruits and vegetables (produce). Grains, beans, nuts, and seeds. Some of these may be available in unpackaged forms or large amounts (in bulk). Fresh seafood. Poultry and eggs. Low-fat dairy products. Buy whole ingredients instead of prepackaged foods. Buy fresh fruits and vegetables in-season from local farmers markets. Buy frozen fruits and vegetables in resealable bags. If you do not have access to quality fresh seafood, buy precooked frozen shrimp or canned fish, such as tuna, salmon, or sardines. Buy small amounts of raw or cooked vegetables, salads, or olives from the deli or salad bar at your store. Stock your pantry so you always have certain foods on hand, such as olive oil, canned tuna, canned tomatoes, rice, pasta, and beans. Cooking  Cook foods with extra-virgin olive oil instead of using butter or other vegetable oils.  Have meat as a side dish, and have vegetables or grains as your main dish. This means having meat in small portions or adding small amounts of meat to foods like pasta or stew. Use beans or vegetables instead of meat in common dishes like chili or lasagna. Experiment with different cooking methods. Try roasting or broiling vegetables instead of steaming or sauteing them. Add frozen vegetables to soups,  stews, pasta, or rice. Add nuts or seeds for added healthy fat at each meal. You can add these to yogurt, salads, or vegetable dishes. Marinate fish or vegetables using olive oil, lemon juice, garlic, and fresh herbs. Meal planning  Plan to eat 1 vegetarian meal one day each week. Try to work up to 2 vegetarian meals, if possible. Eat seafood 2 or more times a week. Have healthy snacks readily available, such as: Vegetable sticks with hummus. Greek yogurt. Fruit and nut trail mix. Eat balanced meals throughout the week. This includes: Fruit: 2-3 servings a day Vegetables: 4-5 servings a day Low-fat dairy: 2 servings a day Fish, poultry, or lean meat: 1 serving a day Beans and legumes: 2 or more servings a week Nuts and seeds: 1-2 servings a day Whole grains: 6-8 servings a day Extra-virgin olive oil: 3-4 servings a day Limit red meat and sweets to only a few servings a month What are my food choices? Mediterranean diet Recommended Grains: Whole-grain pasta. Brown rice. Bulgar wheat. Polenta. Couscous. Whole-wheat bread. Orpah Cobb. Vegetables: Artichokes. Beets. Broccoli. Cabbage. Carrots. Eggplant. Green beans. Chard. Kale. Spinach. Onions. Leeks. Peas. Squash. Tomatoes. Peppers. Radishes. Fruits: Apples. Apricots. Avocado. Berries. Bananas. Cherries. Dates. Figs. Grapes. Lemons. Melon. Oranges. Peaches. Plums. Pomegranate. Meats and other protein foods: Beans. Almonds. Sunflower seeds. Pine nuts. Peanuts. Cod. Salmon. Scallops. Shrimp. Tuna. Tilapia. Clams. Oysters. Eggs. Dairy: Low-fat milk. Cheese. Greek yogurt. Beverages: Water. Red wine. Herbal tea. Fats and oils: Extra virgin olive oil. Avocado oil. Grape seed oil. Sweets and desserts: [place] yogurt with honey. Baked apples. Poached pears. Trail mix. Seasoning and other foods: Basil. Cilantro. Coriander. Cumin. Mint. Parsley. Sage. Rosemary. Tarragon. Garlic. Oregano. Thyme. Pepper. Balsalmic vinegar. Tahini. Hummus. Tomato  sauce. Olives. Mushrooms. Limit these Grains: Prepackaged pasta or rice dishes. Prepackaged cereal with added sugar. Vegetables: Deep fried potatoes (french fries). Fruits: Fruit canned in syrup. Meats and other protein foods: Beef. Pork. Lamb. Poultry with skin. Hot dogs. Tomasa Blase. Dairy: Ice cream. Sour cream. Whole milk. Beverages: Juice. Sugar-sweetened soft drinks. Beer. Liquor and spirits. Fats and oils: Butter. Canola oil. Vegetable oil. Beef fat (tallow). Lard. Sweets and desserts: Cookies. Cakes. Pies. Candy. Seasoning and other foods: Mayonnaise. Premade sauces and marinades. The items listed may not be a complete list. Talk with your dietitian about what dietary choices are right for you. Summary The Mediterranean diet includes both food and lifestyle choices. Eat a variety of fresh fruits and vegetables, beans, nuts, seeds, and whole grains. Limit the amount of red meat and sweets that you eat. Talk with your health care provider about whether it is safe for you to drink red wine in moderation. This means 1 glass a day for nonpregnant women and 2 glasses a day for men. A glass of wine equals 5 oz (150 mL). This information is not intended to replace advice given to you by your health care provider. Make sure you discuss any questions you have with your health care provider. Document Released: 09/10/2015 Document Revised: 10/13/2015 Document Reviewed: 09/10/2015 Elsevier Interactive Patient Education  2017 [employer].

## 2023-05-11 NOTE — Progress Notes (Signed)
 B12 is low, recommend 1000 micrograms daily,  and thyroid levels are normal, thanks

## 2023-05-17 ENCOUNTER — Encounter: Payer: Self-pay | Admitting: Physician Assistant

## 2023-05-17 ENCOUNTER — Other Ambulatory Visit: Payer: Self-pay | Admitting: Family Medicine

## 2023-05-17 DIAGNOSIS — E785 Hyperlipidemia, unspecified: Secondary | ICD-10-CM

## 2023-05-18 ENCOUNTER — Ambulatory Visit
Admission: RE | Admit: 2023-05-18 | Discharge: 2023-05-18 | Disposition: A | Discharge status: Home / Self Care | Source: Ambulatory Visit | Attending: Physician Assistant | Admitting: Physician Assistant

## 2023-05-18 DIAGNOSIS — R413 Other amnesia: Secondary | ICD-10-CM | POA: Diagnosis not present

## 2023-05-25 LAB — ADMARK® APOE GENOTYPE ANALYSIS AND INTERPRETATION SYMPTOMATIC

## 2023-05-25 LAB — QUEST AD-DETECT™, BETA-AMYLOID 42/40 RATIO, PLASMA
ABETA 40: 263 pg/mL
ABETA 42/40 RATIO: 0.16 — ABNORMAL LOW (ref >=0.170)
ABETA 42: 42 pg/mL

## 2023-05-25 LAB — TSH: TSH: 1.25 m[IU]/L (ref 0.40–4.50)

## 2023-05-25 LAB — VITAMIN B1: Vitamin B1 (Thiamine): 8 nmol/L (ref 8–30)

## 2023-05-25 LAB — QUEST AD-DETECT PHOSPHORYLATED TAU217(P-TAU217), PLASMA: Quest Detect PTAU217, Plasma: 0.44 pg/mL — ABNORMAL HIGH (ref <=0.15)

## 2023-05-25 LAB — QUEST AD-DETECT® PHOSPHORYLATED TAU181(P-TAU181), PLASMA: QUEST AD DETECT PTAU181, PLASMA: 1.48 pg/mL — ABNORMAL HIGH (ref <=1.07)

## 2023-05-25 LAB — VITAMIN B12: Vitamin B-12: 331 pg/mL (ref 200–1100)

## 2023-05-25 NOTE — Progress Notes (Signed)
 B1 and B12 are low, take B1 at 100 mg daily and B12 1000 micrograms daily, follow with primary, thanks

## 2023-05-26 ENCOUNTER — Telehealth: Payer: Self-pay | Admitting: Physician Assistant

## 2023-05-26 NOTE — Telephone Encounter (Signed)
 Advised of labs, will close chart

## 2023-05-26 NOTE — Telephone Encounter (Signed)
[name] daughter called in returning a call about results

## 2023-06-19 ENCOUNTER — Ambulatory Visit: Admitting: Physician Assistant

## 2023-07-14 ENCOUNTER — Ambulatory Visit: Admitting: Physician Assistant
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
## 2023-07-18 ENCOUNTER — Encounter: Payer: Self-pay | Admitting: Physician Assistant

## 2023-07-18 ENCOUNTER — Ambulatory Visit: Admitting: Physician Assistant

## 2023-07-18 VITALS — BP 162/85 | HR 71 | Resp 20 | Ht 66.0 in | Wt 175.0 lb

## 2023-07-18 DIAGNOSIS — G309 Alzheimer's disease, unspecified: Secondary | ICD-10-CM | POA: Diagnosis not present

## 2023-07-18 DIAGNOSIS — F0280 Dementia in other diseases classified elsewhere without behavioral disturbance: Secondary | ICD-10-CM | POA: Diagnosis not present

## 2023-08-09 ENCOUNTER — Other Ambulatory Visit: Payer: Self-pay | Admitting: Family Medicine

## 2023-08-09 DIAGNOSIS — E785 Hyperlipidemia, unspecified: Secondary | ICD-10-CM

## 2023-10-03 ENCOUNTER — Ambulatory Visit: Admitting: Physician Assistant

## 2023-11-16 ENCOUNTER — Ambulatory Visit: Payer: Self-pay

## 2023-11-16 ENCOUNTER — Ambulatory Visit: Admitting: Family Medicine

## 2023-11-16 DIAGNOSIS — F29 Unspecified psychosis not due to a substance or known physiological condition: Secondary | ICD-10-CM | POA: Diagnosis not present

## 2023-11-16 DIAGNOSIS — F02818 Dementia in other diseases classified elsewhere, unspecified severity, with other behavioral disturbance: Secondary | ICD-10-CM | POA: Diagnosis not present

## 2023-11-16 DIAGNOSIS — Z79899 Other long term (current) drug therapy: Secondary | ICD-10-CM | POA: Diagnosis not present

## 2023-11-16 DIAGNOSIS — R410 Disorientation, unspecified: Secondary | ICD-10-CM | POA: Diagnosis not present

## 2023-11-16 DIAGNOSIS — G309 Alzheimer's disease, unspecified: Secondary | ICD-10-CM | POA: Diagnosis not present

## 2023-11-16 DIAGNOSIS — F0280 Dementia in other diseases classified elsewhere without behavioral disturbance: Secondary | ICD-10-CM | POA: Diagnosis not present

## 2023-11-16 DIAGNOSIS — R4182 Altered mental status, unspecified: Secondary | ICD-10-CM | POA: Diagnosis not present

## 2023-11-16 DIAGNOSIS — Z87891 Personal history of nicotine dependence: Secondary | ICD-10-CM | POA: Diagnosis not present

## 2023-11-16 DIAGNOSIS — G3109 Other frontotemporal dementia: Secondary | ICD-10-CM | POA: Diagnosis not present

## 2023-11-16 DIAGNOSIS — R442 Other hallucinations: Secondary | ICD-10-CM | POA: Diagnosis not present
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
## 2023-11-16 NOTE — Telephone Encounter (Signed)
 Noted

## 2023-11-16 NOTE — Telephone Encounter (Signed)
 FYI Only or Action Required?: FYI only for provider.  Patient was last seen in primary care on 03/22/2023 by Sebastian Beverley NOVAK, MD.  Called Nurse Triage reporting Altered Mental Status.  Symptoms began Monday.  Interventions attempted: Nothing.  Symptoms are: unchanged.  Triage Disposition: See HCP Within 4 Hours (Or PCP Triage)  Patient/caregiver understands and will follow disposition?: Yes    Copied from CRM #[id number]. Topic: Clinical - Red Word Triage >> Nov 16, 2023 11:39 AM Pinkey ORN wrote: Red Word that prompted transfer to Nurse Triage: Confused Mental State >> Nov 16, 2023 11:41 AM Pinkey ORN wrote: [name] daughter states the patient has a possible UTI. States the patient is a bit more confused and seeing things that aren't there.  Reason for Disposition  [1] Acting confused (e.g., disoriented, slurred speech) AND [2] brief (now gone)  Answer Assessment - Initial Assessment Questions 1. LEVEL OF CONSCIOUSNESS: How are they (the patient) acting right now? (e.g., alert-oriented, confused, lethargic, stuporous, comatose)     Extremely confused, seeing things, 2. ONSET: When did the confusion start?  (e.g., minutes, hours, days)     Monday 3. PATTERN: Does this come and go, or has it been constant since it started?  Is it present now?     constant 4. ALCOHOL or DRUGS: Have they been drinking alcohol or taking any drugs?      na 5. NARCOTIC MEDICINES: Have they been receiving any narcotic medications? (e.g., morphine, Vicodin)     na 6. CAUSE: What do you think is causing the confusion?      UTI 7. OTHER SYMPTOMS: Are there any other symptoms? (e.g., difficulty breathing, fever, headache, weakness)     Irritated  [name] daughter is a Engineer, civil (consulting) and suspects possible UTI  Protocols used: Confusion - Delirium-A-AH

## 2023-11-17 ENCOUNTER — Ambulatory Visit: Payer: Self-pay | Admitting: Family Medicine
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
## 2023-11-22 ENCOUNTER — Ambulatory Visit (INDEPENDENT_AMBULATORY_CARE_PROVIDER_SITE_OTHER)

## 2023-11-22 ENCOUNTER — Ambulatory Visit: Payer: Self-pay | Admitting: Family Medicine

## 2023-11-22 ENCOUNTER — Telehealth: Payer: Self-pay | Admitting: Family Medicine

## 2023-11-22 DIAGNOSIS — Z111 Encounter for screening for respiratory tuberculosis: Secondary | ICD-10-CM
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
## 2023-11-22 NOTE — Telephone Encounter (Signed)
 CLINICAL USE BELOW THIS LINE (use X to signify action taken)  _x_ Form received and placed in providers office for signature. ___ Form completed and faxed to LOA Dept.  ___ Form completed & LVM to notify patient ready for pick up.  ___ Charge sheet and copy of form in front office folder for office supervisor.    Patient is scheduled to be seen on Friday, 11/24/2023.

## 2023-11-22 NOTE — Telephone Encounter (Signed)
 Patient dropped off document FL2, to be filled out by provider. Patient requested to send it back via Call Patient to pick up within 7-days. Document is located in providers tray at front office.Please advise at Mobile [phone] (mobile) or [phone]

## 2023-11-22 NOTE — Progress Notes (Signed)
 PPD Placement note Velma JONELLE Mussel, 78 y.o. male is here today for placement of PPD test Reason for PPD test: facilty Pt taken PPD test before: yes Verified in allergy area and with patient that they are not allergic to the products PPD is made of (Phenol or Tween). Yes Is patient taking any oral or IV steroid medication now or have they taken it in the last month? no Has the patient ever received the BCG vaccine?: no Has the patient been in recent contact with anyone known or suspected of having active TB disease?: no      Date of exposure (if applicable): n/a      Name of person they were exposed to (if applicable): n/a Patient's Country of origin?: [place]  O: Alert and oriented in NAD. P:  PPD placed on 11/22/2023.  Patient advised to return for reading within 48-72 hours.

## 2023-11-22 NOTE — Telephone Encounter (Signed)
 Paperwork was collected and placed in provider sign/review folder on 11/22/2023 @1 :30pm

## 2023-11-23 NOTE — Telephone Encounter (Signed)
 Noted

## 2023-11-24 ENCOUNTER — Ambulatory Visit

## 2023-11-24 ENCOUNTER — Ambulatory Visit (INDEPENDENT_AMBULATORY_CARE_PROVIDER_SITE_OTHER): Admitting: Family Medicine

## 2023-11-24 ENCOUNTER — Encounter: Payer: Self-pay | Admitting: Family Medicine

## 2023-11-24 VITALS — BP 152/85 | HR 61 | Temp 98.1°F | Resp 18 | Ht 66.0 in | Wt 174.8 lb

## 2023-11-24 DIAGNOSIS — I10 Essential (primary) hypertension: Secondary | ICD-10-CM

## 2023-11-24 DIAGNOSIS — G301 Alzheimer's disease with late onset: Secondary | ICD-10-CM | POA: Diagnosis not present

## 2023-11-24 DIAGNOSIS — N529 Male erectile dysfunction, unspecified: Secondary | ICD-10-CM

## 2023-11-24 DIAGNOSIS — F02B11 Dementia in other diseases classified elsewhere, moderate, with agitation: Secondary | ICD-10-CM

## 2023-11-24 DIAGNOSIS — E785 Hyperlipidemia, unspecified: Secondary | ICD-10-CM | POA: Diagnosis not present

## 2023-11-24 LAB — TB SKIN TEST
Induration: 0 mm
TB Skin Test: NEGATIVE

## 2023-11-24 MED ORDER — AMLODIPINE BESYLATE 5 MG PO TABS: 5.0000 mg | ORAL_TABLET | Freq: Every day | ORAL | 3 refills | Status: AC

## 2023-11-24 MED ORDER — MEMANTINE HCL 10 MG PO TABS: 10.0000 mg | ORAL_TABLET | Freq: Two times a day (BID) | ORAL | 3 refills | Status: AC

## 2023-11-24 MED ORDER — SIMVASTATIN 20 MG PO TABS: ORAL_TABLET | ORAL | 3 refills | Status: AC

## 2023-11-24 MED ORDER — ENALAPRIL MALEATE 20 MG PO TABS: 20.0000 mg | ORAL_TABLET | Freq: Two times a day (BID) | ORAL | 3 refills | Status: AC

## 2023-11-24 MED ORDER — SILDENAFIL CITRATE 100 MG PO TABS: 50.0000 mg | ORAL_TABLET | Freq: Every day | ORAL | 0 refills | Status: AC | PRN

## 2023-11-24 MED ORDER — BREXPIPRAZOLE 0.5 MG PO TABS: ORAL_TABLET | ORAL | 11 refills | Status: AC

## 2023-11-24 NOTE — Progress Notes (Signed)
 PPD Reading Note PPD read and results entered in EpicCare. Result: Negative, 0 mm induration. Patient came in office today to have his PPD test read and it was 0 mm. A copy was made and given to patient and his daughter.  Allergic reaction: no
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
## 2023-11-28 ENCOUNTER — Telehealth: Payer: Self-pay

## 2023-11-28 NOTE — Telephone Encounter (Signed)
 Can we start/submit prior authorization for ( Brexpiprazole 0.5 MG TABS)

## 2023-11-29 ENCOUNTER — Other Ambulatory Visit (HOSPITAL_COMMUNITY): Payer: Self-pay

## 2023-11-29 ENCOUNTER — Telehealth: Payer: Self-pay

## 2023-11-29 NOTE — Telephone Encounter (Signed)
 Noted

## 2023-11-29 NOTE — Telephone Encounter (Signed)
 Pharmacy Patient Advocate Encounter   Received notification from Pt Calls Messages that prior authorization for Rexulti 0.5MG  tablets  is required/requested.   Insurance verification completed.   The patient is insured through [employer].   Per test claim: PA required; PA submitted to above mentioned insurance via Latent Key/confirmation #/EOC [id number] Status is pending

## 2023-11-29 NOTE — Telephone Encounter (Signed)
 Pharmacy Patient Advocate Encounter  Received notification from [employer] that Prior Authorization for REXULTI has been APPROVED from 1.1.25 to 12.31.26. Ran test claim, Copay is $RTS, RX WAS LAST FILLED ON TODAY 10.29.25. This test claim was processed through [facility]- copay amounts may vary at other pharmacies due to pharmacy/plan contracts, or as the patient moves through the different stages of their insurance plan.   PA #/Case ID/Reference #: [id number]

## 2023-12-01 ENCOUNTER — Other Ambulatory Visit (HOSPITAL_COMMUNITY): Payer: Self-pay

## 2023-12-13 ENCOUNTER — Telehealth: Payer: Self-pay | Admitting: Family Medicine

## 2023-12-13 NOTE — Telephone Encounter (Signed)
 Noted

## 2023-12-13 NOTE — Telephone Encounter (Signed)
 Spoke to [employer] from [facility]) and advised that all paperwork wasn't received. Dentia verbalized understanding and stated she will fax and email form today. Pt will need diet order and LOV within 30 days to see signed off.

## 2023-12-13 NOTE — Telephone Encounter (Signed)
 Copied from CRM [id number]. Topic: Medical Record Request - Other >> Dec 13, 2023 10:20 AM China J wrote: Reason for CRM: Denita calling from [employer] wanting us  to keep a look out for documentation she is needing before the patient moves into the facility. The paper work has been faxed over.

## 2023-12-20 DIAGNOSIS — Z0279 Encounter for issue of other medical certificate: Secondary | ICD-10-CM

## 2023-12-20 NOTE — Telephone Encounter (Signed)
 CLINICAL USE BELOW THIS LINE (use X to signify taken)  ____Form received and placed in providers office for signature. ____Form completed and faxed to LOA Dept. __x__Form completed & LVM to notify pt ready for pick up __x__Charge sheet & copy of form in front office folder for office supervisor.

## 2023-12-27 DIAGNOSIS — Z9181 History of falling: Secondary | ICD-10-CM | POA: Diagnosis not present

## 2023-12-27 DIAGNOSIS — M150 Primary generalized (osteo)arthritis: Secondary | ICD-10-CM | POA: Diagnosis not present

## 2023-12-27 DIAGNOSIS — G309 Alzheimer's disease, unspecified: Secondary | ICD-10-CM | POA: Diagnosis not present

## 2023-12-27 DIAGNOSIS — I10 Essential (primary) hypertension: Secondary | ICD-10-CM | POA: Diagnosis not present

## 2023-12-27 DIAGNOSIS — F02811 Dementia in other diseases classified elsewhere, unspecified severity, with agitation: Secondary | ICD-10-CM | POA: Diagnosis not present

## 2023-12-27 DIAGNOSIS — R2681 Unsteadiness on feet: Secondary | ICD-10-CM | POA: Diagnosis not present

## 2023-12-27 DIAGNOSIS — E7800 Pure hypercholesterolemia, unspecified: Secondary | ICD-10-CM | POA: Diagnosis not present

## 2024-01-03 DIAGNOSIS — F411 Generalized anxiety disorder: Secondary | ICD-10-CM | POA: Diagnosis not present

## 2024-01-03 DIAGNOSIS — F02C18 Dementia in other diseases classified elsewhere, severe, with other behavioral disturbance: Secondary | ICD-10-CM | POA: Diagnosis not present

## 2024-01-03 DIAGNOSIS — G309 Alzheimer's disease, unspecified: Secondary | ICD-10-CM | POA: Diagnosis not present
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
## 2024-01-17 ENCOUNTER — Ambulatory Visit: Admitting: Physician Assistant

## 2024-04-09 ENCOUNTER — Ambulatory Visit
